# Patient Record
Sex: Female | Born: 1990 | Race: White | Hispanic: No | Smoking: Never smoker
Health system: Southern US, Community
[De-identification: ages and names within clinical notes are randomized; demographics above are authoritative.]

## PROBLEM LIST (undated history)

## (undated) DIAGNOSIS — E663 Overweight: Secondary | ICD-10-CM

## (undated) HISTORY — PX: CHOLECYSTECTOMY: SHX55

## (undated) HISTORY — DX: Overweight: E66.3

## (undated) HISTORY — PX: TUBAL LIGATION: SHX77

## (undated) HISTORY — PX: OTHER SURGICAL HISTORY: SHX169

---
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
## 2010-01-30 IMAGING — US US OB FOLLOW UP
1 series · 14 of 28 positions shown · non-contrast
Comparison: none

OBSTETRICAL ULTRASOUND:
 This ultrasound exam was performed in the [HOSPITAL] Ultrasound Department.  The OB US report was generated in the AS system, and faxed to the ordering physician.  This report is also available in [HOSPITAL]?s AccessANYware and in [REDACTED] PACS.

[Series 1: us ob follow up · 0.27mm/px · 14 of 45 slices shown]
[im 2/45]
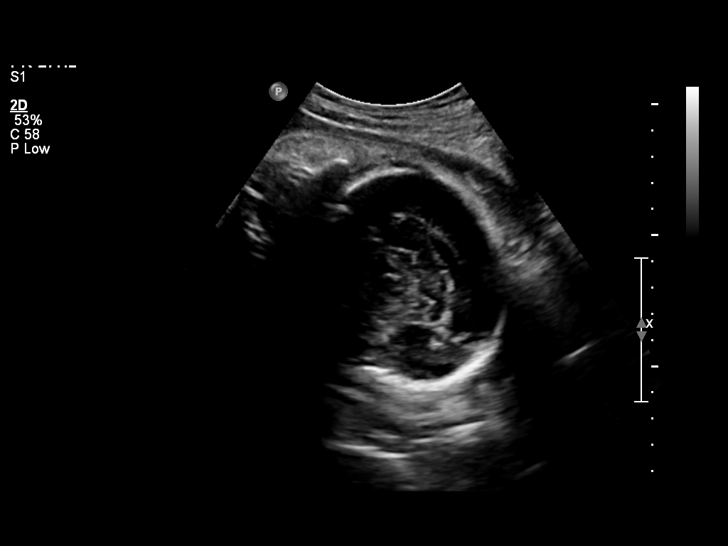
[im 5/45]
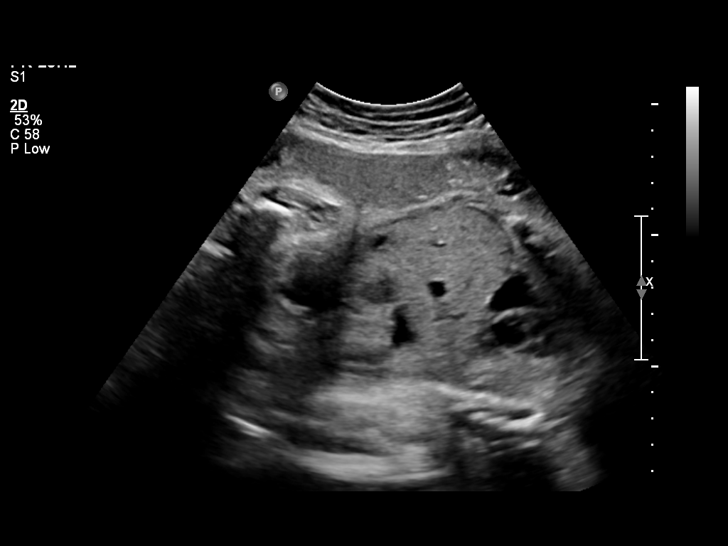
[im 9/45]
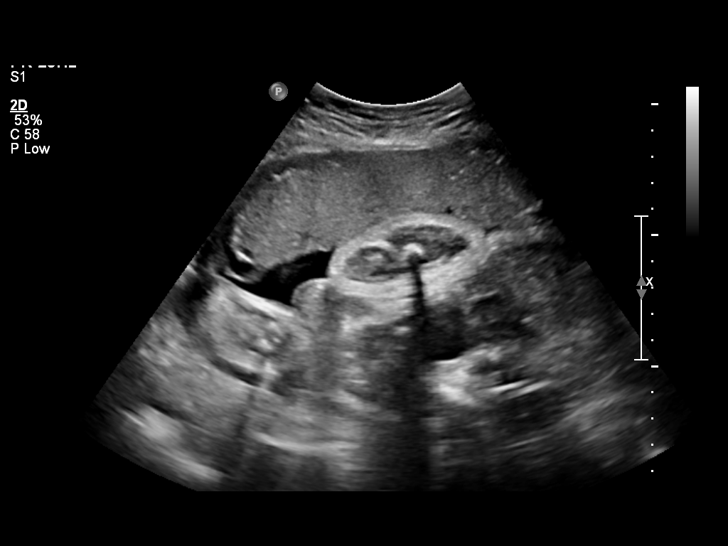
[im 12/45]
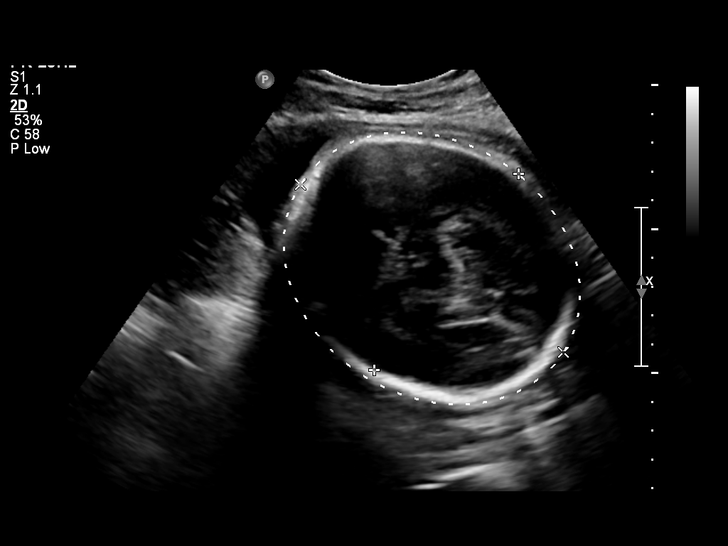
[im 15/45]
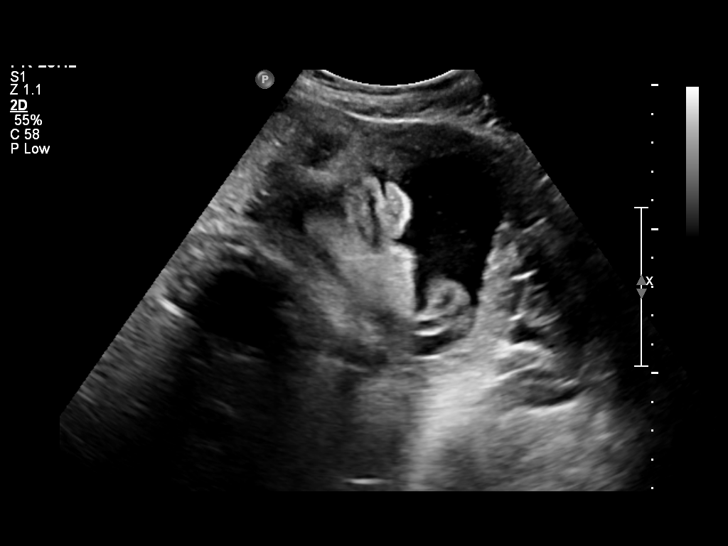
[im 18/45]
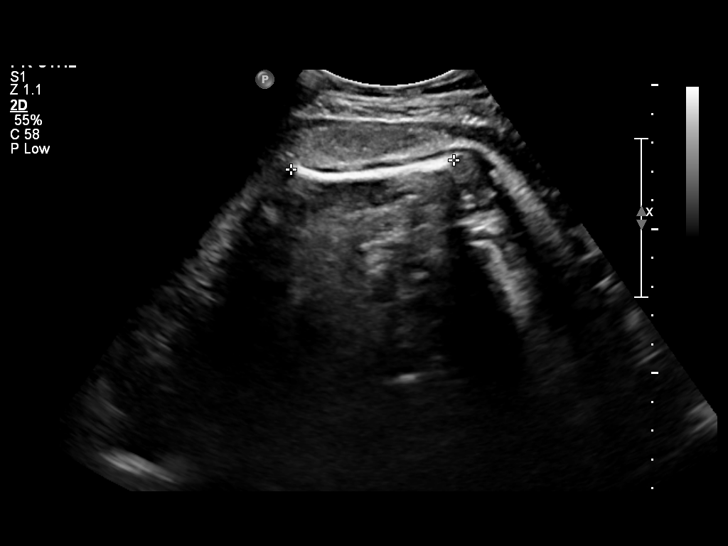
[im 22/45]
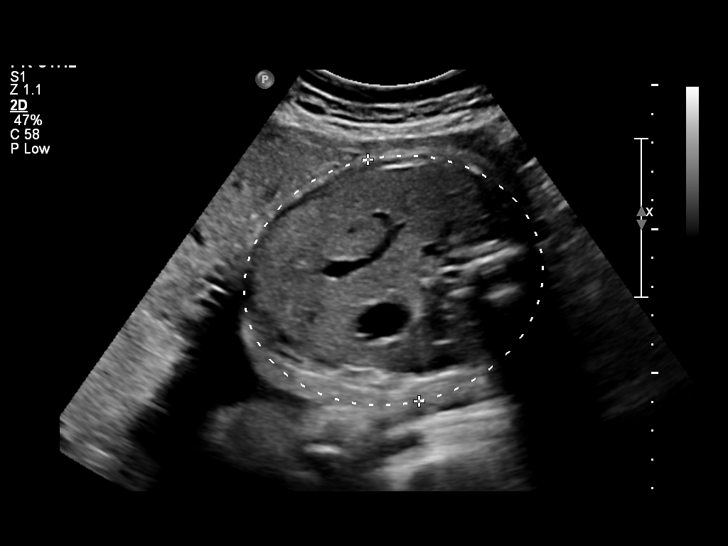
[im 25/45]
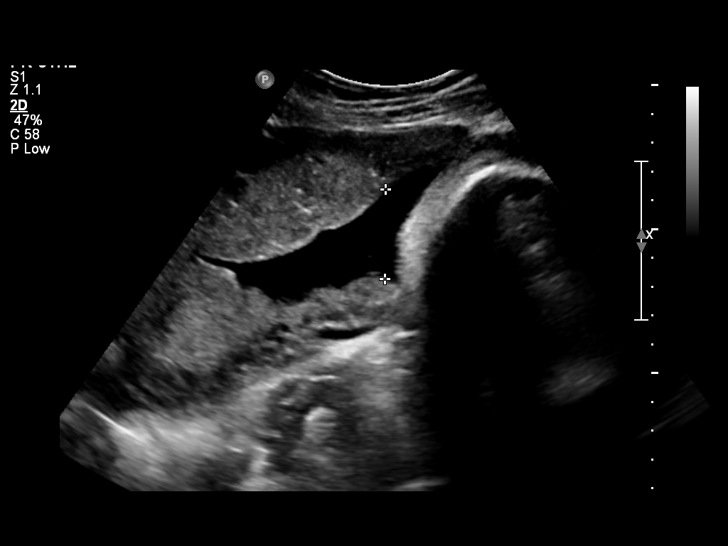
[im 28/45]
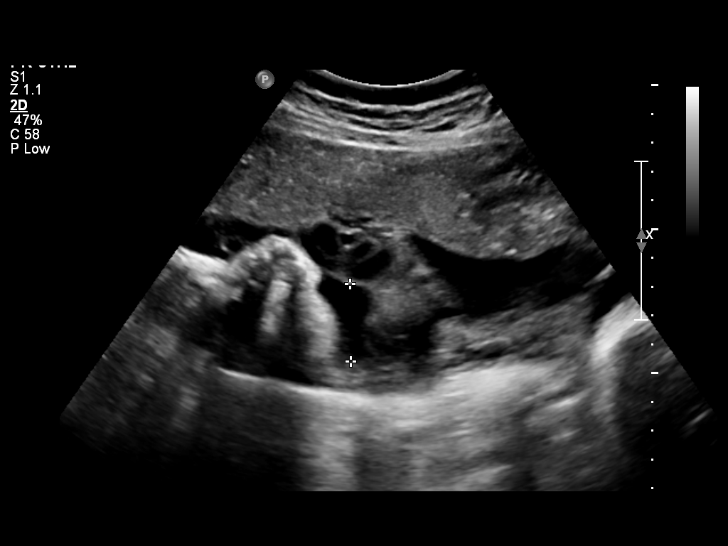
[im 31/45]
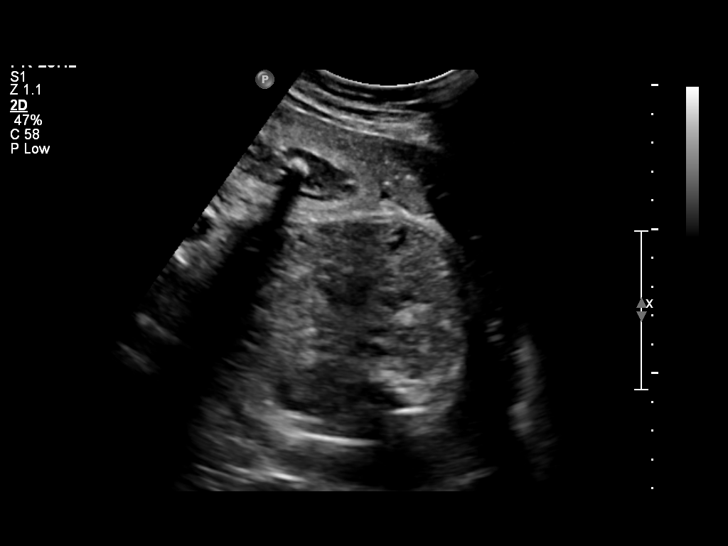
[im 35/45]
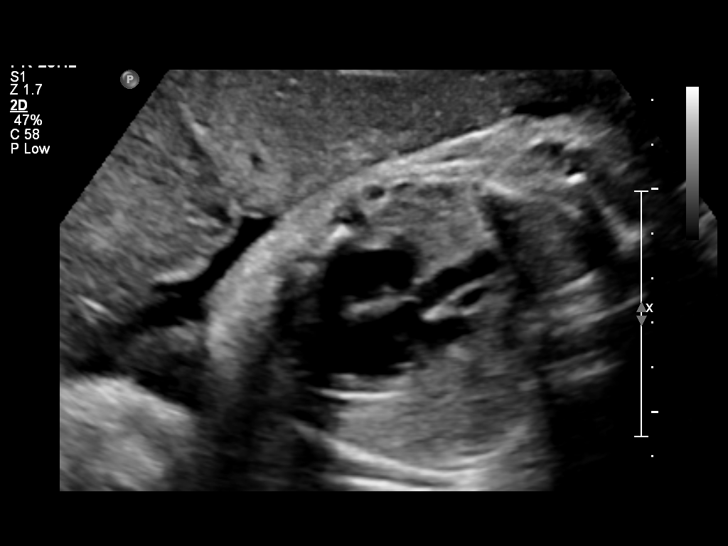
[im 38/45]
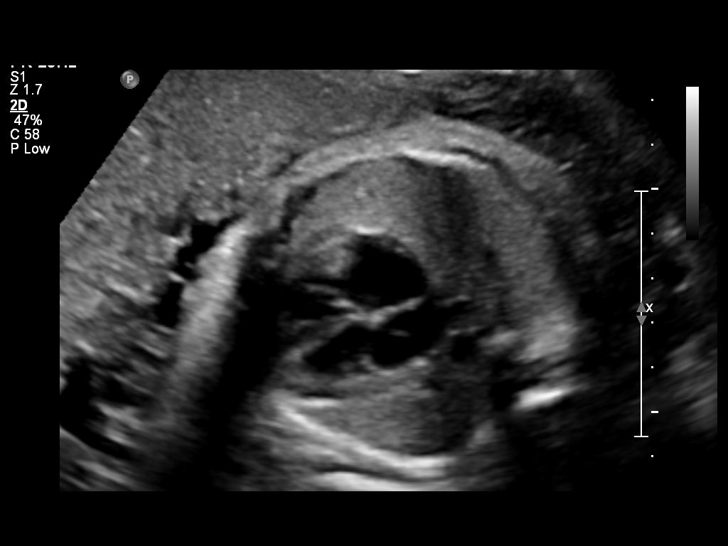
[im 41/45]
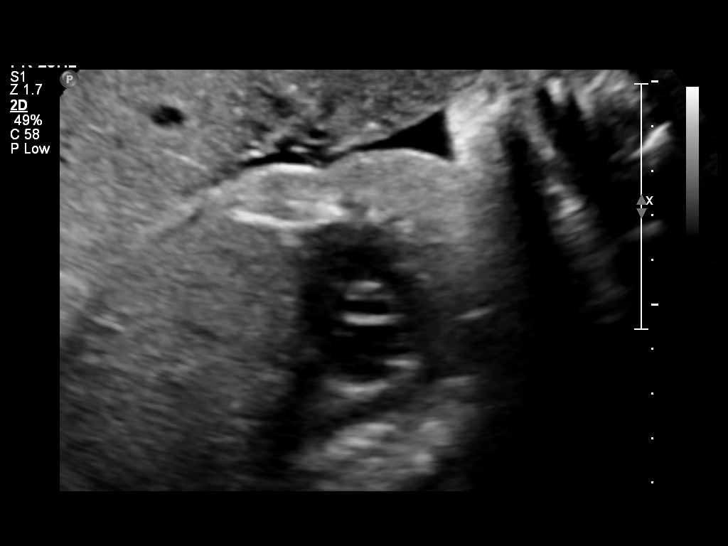
[im 45/45]
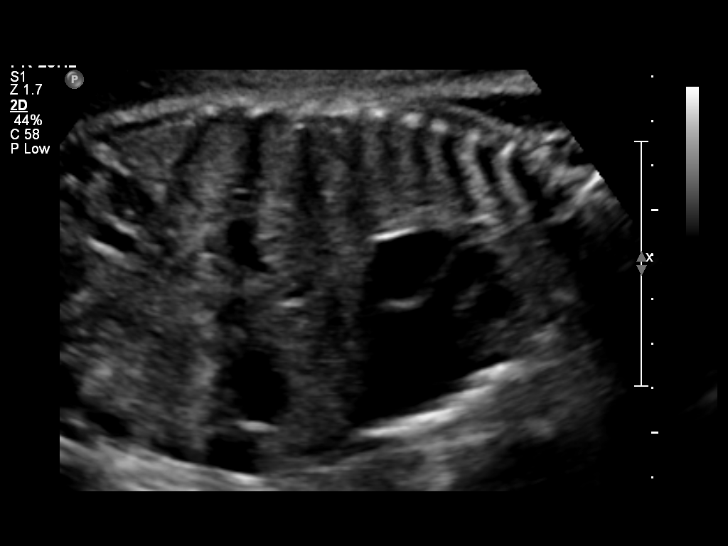

[14 of 28 positions shown; findings below may reference images not displayed]

IMPRESSION: See AS Obstetric US report.

## 2010-08-29 LAB — CBC
HCT: 31.5 % — ABNORMAL LOW (ref 36.0–46.0)
Hemoglobin: 11.3 g/dL — ABNORMAL LOW (ref 12.0–15.0)
MCHC: 34.1 g/dL (ref 30.0–36.0)
MCV: 84 fL (ref 78.0–100.0)
Platelets: 225 10*3/uL (ref 150–400)
RBC: 3.75 MIL/uL — ABNORMAL LOW (ref 3.87–5.11)
RBC: 3.9 MIL/uL (ref 3.87–5.11)
RDW: 13.5 % (ref 11.5–15.5)
WBC: 11.6 10*3/uL — ABNORMAL HIGH (ref 4.0–10.5)

## 2010-08-29 LAB — RPR
RPR Ser Ql: NONREACTIVE
RPR Ser Ql: NONREACTIVE

## 2010-09-01 LAB — URINALYSIS, ROUTINE W REFLEX MICROSCOPIC
Bilirubin Urine: NEGATIVE
Nitrite: NEGATIVE
Specific Gravity, Urine: 1.015 (ref 1.005–1.030)
Urobilinogen, UA: 1 mg/dL (ref 0.0–1.0)
pH: 7.5 (ref 5.0–8.0)

## 2010-09-01 LAB — URINE MICROSCOPIC-ADD ON

## 2010-10-11 NOTE — Assessment & Plan Note (Signed)
NAME:  LEATHIE, WEICH                 ACCOUNT NO.:  [id number]   MEDICAL RECORD NO.:  [id number]          PATIENT TYPE:  POB   LOCATION:  [facility]         FACILITY:  [facility]   PHYSICIAN:  Scheryl Darter, MD       DATE OF BIRTH:  1990-09-20   DATE OF SERVICE:                                  CLINIC NOTE   The patient is postpartum from vaginal delivery on December 30.  Baby is  doing well.  She was 37 weeks' gestation.  She has not had unprotected  intercourse since delivery.  She would like to have Mirena placed.  She  had this placed previously at another clinic and was only kept in for a  month due to bleeding.  She understands now that is fairly normal.  She  does not plan to conceive for at least 6-7 years.  No complaints of  bleeding.   Physical exam, no acute distress.  Abdomen is soft, nontender.  Pelvic  exam, external genitalia.  Vagina and cervix appeared normal.  Uterus is  normal size.  No adnexal masses or tenderness.  She is doing well  postpartum.  She can return for Mirena placement.  Advised her to  abstain from intercourse until the Mirena is placed.      Scheryl Darter, MD     JA/MEDQ  D:  07/08/2009  T:  07/09/2009  Job:  [id number]

## 2012-11-23 ENCOUNTER — Emergency Department: Payer: Self-pay | Admitting: Emergency Medicine

## 2012-11-23 LAB — URINALYSIS, COMPLETE
Bacteria: NONE SEEN
Bilirubin,UR: NEGATIVE
Blood: NEGATIVE
Glucose,UR: NEGATIVE mg/dL (ref 0–75)
Ketone: NEGATIVE
Nitrite: NEGATIVE
RBC,UR: 2 /HPF (ref 0–5)
Specific Gravity: 1.02 (ref 1.003–1.030)

## 2012-11-23 LAB — COMPREHENSIVE METABOLIC PANEL
Alkaline Phosphatase: 81 U/L (ref 50–136)
Anion Gap: 6 — ABNORMAL LOW (ref 7–16)
BUN: 17 mg/dL (ref 7–18)
Bilirubin,Total: 0.5 mg/dL (ref 0.2–1.0)
Co2: 27 mmol/L (ref 21–32)
Creatinine: 0.77 mg/dL (ref 0.60–1.30)
EGFR (African American): >60
Glucose: 84 mg/dL (ref 65–99)
Osmolality: 280 (ref 275–301)
SGOT(AST): 16 U/L (ref 15–37)
SGPT (ALT): 13 U/L (ref 12–78)
Sodium: 140 mmol/L (ref 136–145)

## 2012-11-23 LAB — CBC
HCT: 36 % (ref 35.0–47.0)
HGB: 12.2 g/dL (ref 12.0–16.0)
MCHC: 34 g/dL (ref 32.0–36.0)

## 2012-11-23 LAB — LIPASE, BLOOD: Lipase: 98 U/L (ref 73–393)

## 2013-01-18 ENCOUNTER — Emergency Department: Payer: Self-pay | Admitting: Emergency Medicine

## 2013-01-18 LAB — CBC: RBC: 4.45 10*6/uL (ref 3.80–5.20)

## 2013-01-18 LAB — URINALYSIS, COMPLETE
Glucose,UR: NEGATIVE mg/dL (ref 0–75)
Nitrite: NEGATIVE
Ph: 7 (ref 4.5–8.0)
Protein: NEGATIVE
RBC,UR: <1 /HPF (ref 0–5)
Specific Gravity: 1.02 (ref 1.003–1.030)

## 2013-01-18 LAB — LIPASE, BLOOD: Lipase: 127 U/L (ref 73–393)

## 2013-01-18 LAB — COMPREHENSIVE METABOLIC PANEL
Anion Gap: 3 — ABNORMAL LOW (ref 7–16)
Bilirubin,Total: 0.4 mg/dL (ref 0.2–1.0)
Calcium, Total: 9.4 mg/dL (ref 8.5–10.1)
Creatinine: 0.64 mg/dL (ref 0.60–1.30)
EGFR (Non-African Amer.): >60
Glucose: 86 mg/dL (ref 65–99)
Osmolality: 274 (ref 275–301)
Potassium: 4 mmol/L (ref 3.5–5.1)
SGOT(AST): 17 U/L (ref 15–37)
SGPT (ALT): 14 U/L (ref 12–78)
Total Protein: 7.9 g/dL (ref 6.4–8.2)

## 2013-01-18 LAB — HCG, QUANTITATIVE, PREGNANCY: Beta Hcg, Quant.: <1 m[IU]/mL — ABNORMAL LOW

## 2013-01-18 LAB — WET PREP, GENITAL

## 2013-02-11 ENCOUNTER — Emergency Department: Payer: Self-pay | Admitting: Emergency Medicine

## 2013-02-11 LAB — COMPREHENSIVE METABOLIC PANEL
Albumin: 3.8 g/dL (ref 3.4–5.0)
Alkaline Phosphatase: 78 U/L (ref 50–136)
BUN: 12 mg/dL (ref 7–18)
Calcium, Total: 8.9 mg/dL (ref 8.5–10.1)
Chloride: 104 mmol/L (ref 98–107)
Creatinine: 0.67 mg/dL (ref 0.60–1.30)
Osmolality: 273 (ref 275–301)
Potassium: 3.3 mmol/L — ABNORMAL LOW (ref 3.5–5.1)
SGOT(AST): 17 U/L (ref 15–37)
SGPT (ALT): 13 U/L (ref 12–78)

## 2013-02-11 LAB — URINALYSIS, COMPLETE
Bilirubin,UR: NEGATIVE
Blood: NEGATIVE
Ketone: NEGATIVE
Nitrite: NEGATIVE
Ph: 6 (ref 4.5–8.0)
Protein: NEGATIVE
Specific Gravity: 1.015 (ref 1.003–1.030)
Squamous Epithelial: 8
WBC UR: 1 /HPF (ref 0–5)

## 2013-02-11 LAB — CBC
MCH: 29.1 pg (ref 26.0–34.0)
MCHC: 34.3 g/dL (ref 32.0–36.0)
Platelet: 246 10*3/uL (ref 150–440)
WBC: 11.6 10*3/uL — ABNORMAL HIGH (ref 3.6–11.0)

## 2013-02-12 ENCOUNTER — Emergency Department: Payer: Self-pay | Admitting: Emergency Medicine

## 2013-02-12 LAB — HCG, QUANTITATIVE, PREGNANCY: Beta Hcg, Quant.: 474 m[IU]/mL — ABNORMAL HIGH

## 2013-03-20 ENCOUNTER — Emergency Department: Payer: Self-pay | Admitting: Internal Medicine

## 2013-03-20 LAB — CBC
HCT: 33.7 % — ABNORMAL LOW (ref 35.0–47.0)
HGB: 12 g/dL (ref 12.0–16.0)
MCH: 29.3 pg (ref 26.0–34.0)
MCHC: 35.6 g/dL (ref 32.0–36.0)
RBC: 4.09 10*6/uL (ref 3.80–5.20)
RDW: 13.5 % (ref 11.5–14.5)
WBC: 11.1 10*3/uL — ABNORMAL HIGH (ref 3.6–11.0)

## 2013-03-20 LAB — BASIC METABOLIC PANEL
Calcium, Total: 9.3 mg/dL (ref 8.5–10.1)
Co2: 23 mmol/L (ref 21–32)
Creatinine: 0.47 mg/dL — ABNORMAL LOW (ref 0.60–1.30)
EGFR (Non-African Amer.): >60
Glucose: 80 mg/dL (ref 65–99)
Osmolality: 267 (ref 275–301)
Sodium: 134 mmol/L — ABNORMAL LOW (ref 136–145)

## 2013-03-20 LAB — URINALYSIS, COMPLETE
Bilirubin,UR: NEGATIVE
Blood: NEGATIVE
Glucose,UR: NEGATIVE mg/dL (ref 0–75)
Leukocyte Esterase: NEGATIVE
Protein: NEGATIVE
RBC,UR: 1 /HPF (ref 0–5)
Squamous Epithelial: 3
WBC UR: 1 /HPF (ref 0–5)

## 2013-03-20 LAB — GC/CHLAMYDIA PROBE AMP

## 2013-04-14 ENCOUNTER — Encounter: Payer: Self-pay | Admitting: Maternal and Fetal Medicine

## 2013-08-20 ENCOUNTER — Observation Stay: Payer: Self-pay | Admitting: Obstetrics & Gynecology

## 2013-08-20 LAB — URINALYSIS, COMPLETE
BLOOD: NEGATIVE
Bacteria: NONE SEEN
Bilirubin,UR: NEGATIVE
GLUCOSE, UR: NEGATIVE mg/dL (ref 0–75)
KETONE: NEGATIVE
LEUKOCYTE ESTERASE: NEGATIVE
Nitrite: NEGATIVE
Ph: 7 (ref 4.5–8.0)
RBC, UR: NONE SEEN /HPF (ref 0–5)
Specific Gravity: 1.025 (ref 1.003–1.030)
Squamous Epithelial: 7
WBC UR: <1 /HPF (ref 0–5)

## 2013-10-01 ENCOUNTER — Observation Stay: Payer: Self-pay

## 2013-10-14 ENCOUNTER — Inpatient Hospital Stay: Payer: Self-pay

## 2013-10-14 LAB — CBC WITH DIFFERENTIAL/PLATELET
Basophil #: 0.1 10*3/uL (ref 0.0–0.1)
Basophil %: 0.6 %
Eosinophil #: 0.1 10*3/uL (ref 0.0–0.7)
Eosinophil %: 1 %
HCT: 33.1 % — AB (ref 35.0–47.0)
HGB: 10.9 g/dL — ABNORMAL LOW (ref 12.0–16.0)
Lymphocyte #: 2.5 10*3/uL (ref 1.0–3.6)
Lymphocyte %: 20.6 %
MCH: 26.1 pg (ref 26.0–34.0)
MCHC: 32.8 g/dL (ref 32.0–36.0)
MCV: 80 fL (ref 80–100)
Monocyte #: 0.8 x10 3/mm (ref 0.2–0.9)
Monocyte %: 6.1 %
NEUTROS ABS: 8.9 10*3/uL — AB (ref 1.4–6.5)
Neutrophil %: 71.7 %
Platelet: 219 10*3/uL (ref 150–440)
RBC: 4.16 10*6/uL (ref 3.80–5.20)
RDW: 14.2 % (ref 11.5–14.5)
WBC: 12.3 10*3/uL — ABNORMAL HIGH (ref 3.6–11.0)

## 2013-10-15 LAB — HEMATOCRIT: HCT: 28.3 % — AB (ref 35.0–47.0)

## 2013-12-05 ENCOUNTER — Emergency Department: Payer: Self-pay | Admitting: Emergency Medicine

## 2013-12-07 ENCOUNTER — Emergency Department: Payer: Self-pay | Admitting: Emergency Medicine

## 2013-12-09 ENCOUNTER — Emergency Department: Payer: Self-pay | Admitting: Emergency Medicine

## 2014-01-14 IMAGING — US US OB NUCHAL TRANSLUCENCY 1ST GEST - [facility] NRPT
1 series · 14 of 28 positions shown · non-contrast
Comparison: none

[Series 1: us ob nuchal translucency 1st gest - [facility] nrpt · 0.23mm/px · 14 of 37 slices shown]
[im 2/37]
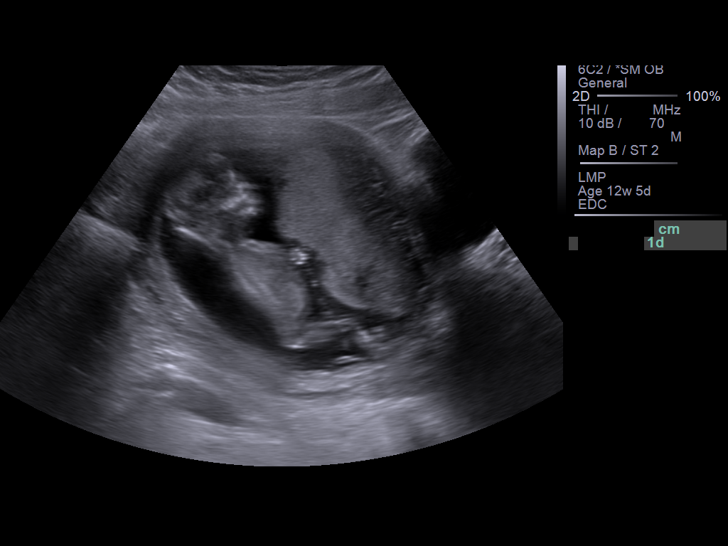
[im 5/37]
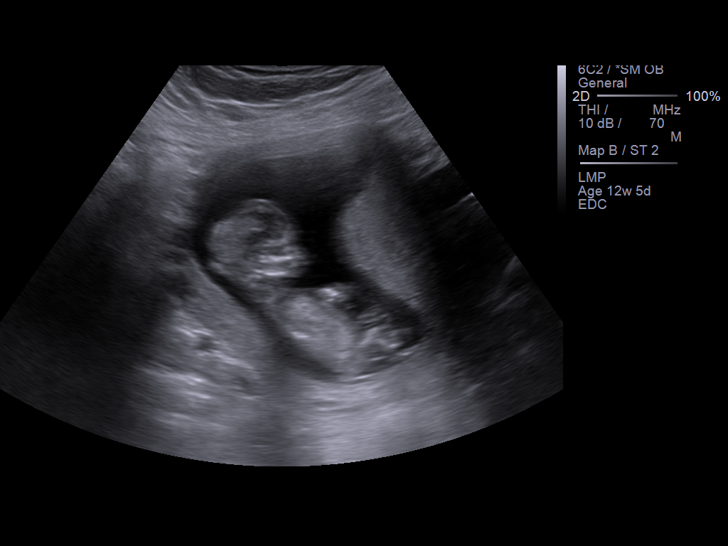
[im 7/37]
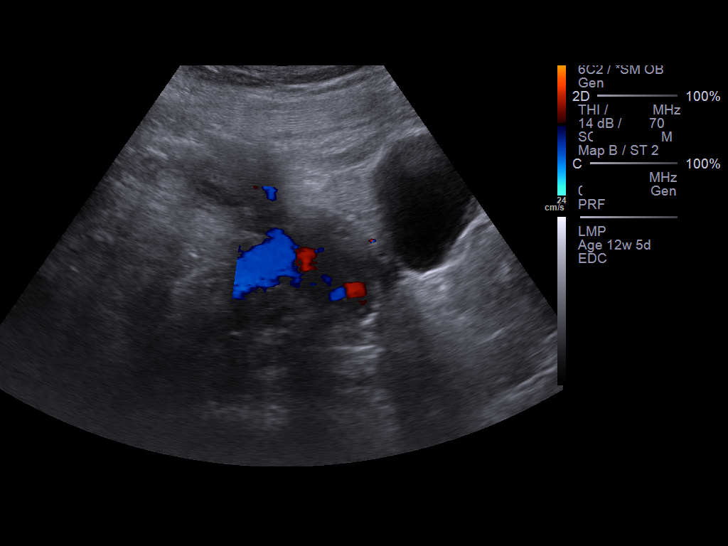
[im 10/37]
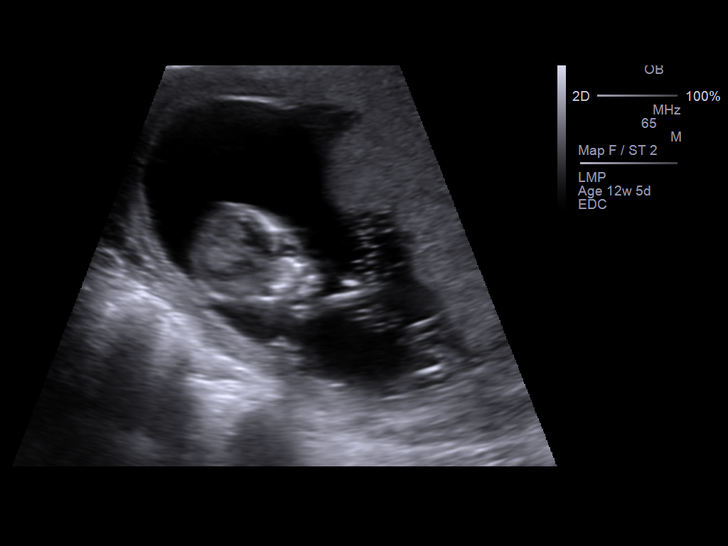
[im 13/37]
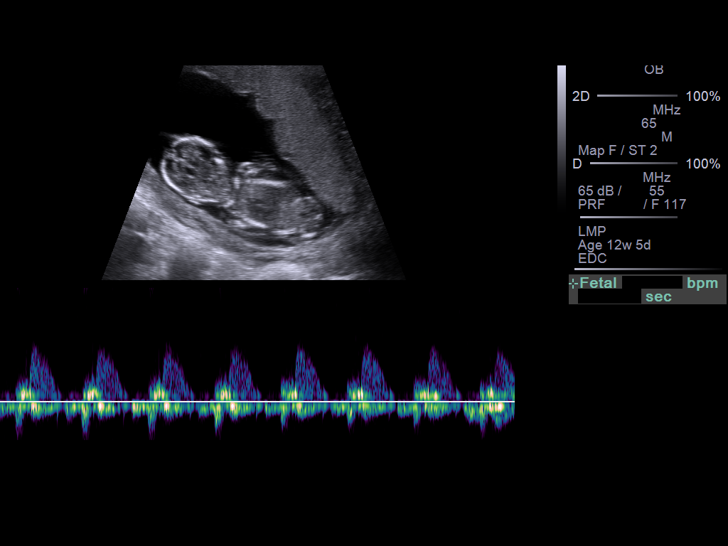
[im 15/37]
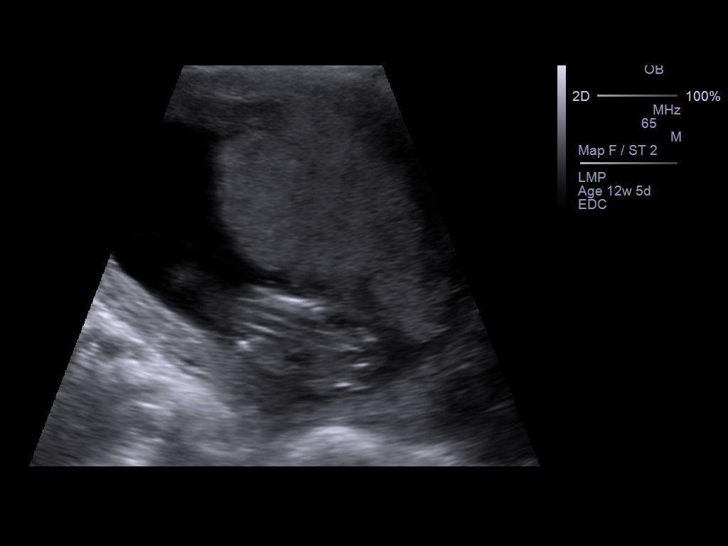
[im 18/37]
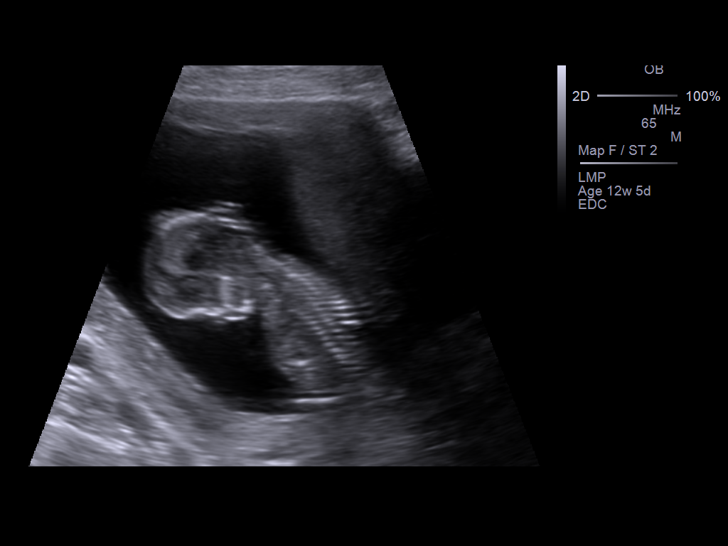
[im 21/37]
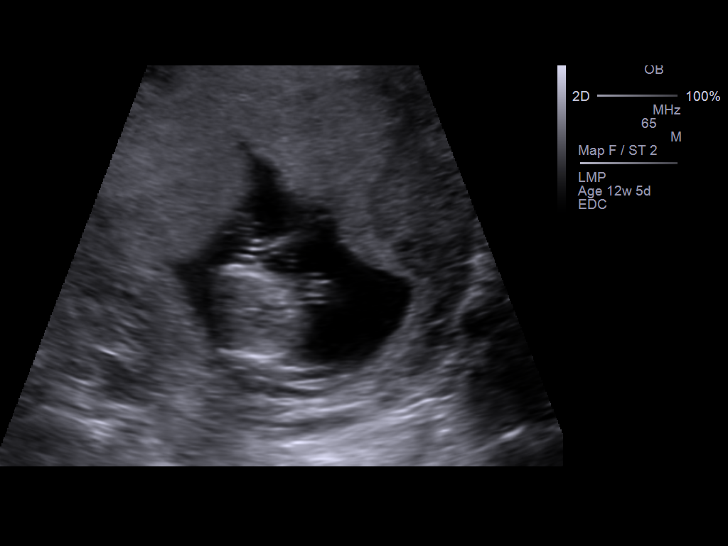
[im 23/37]
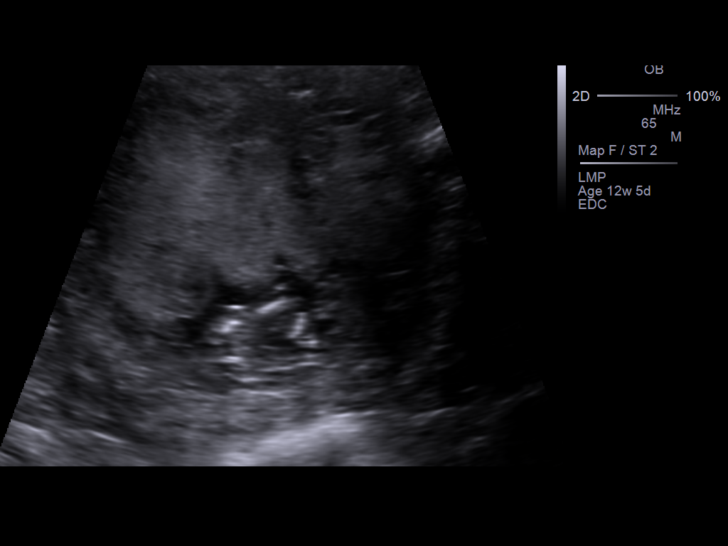
[im 26/37]
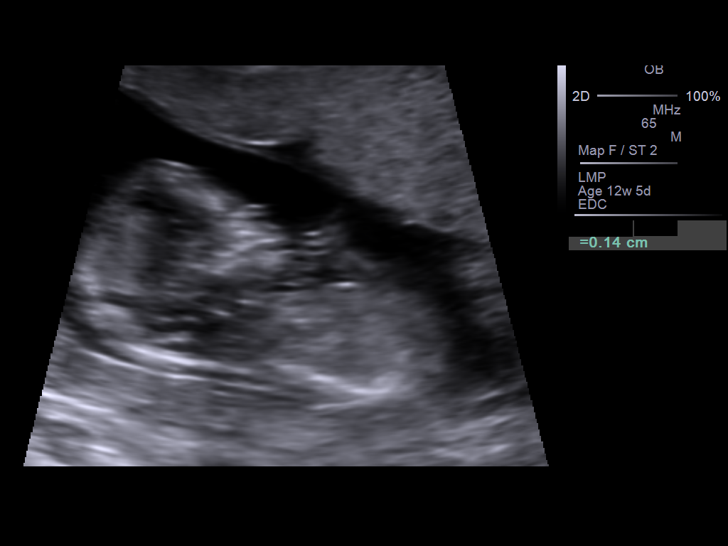
[im 29/37]
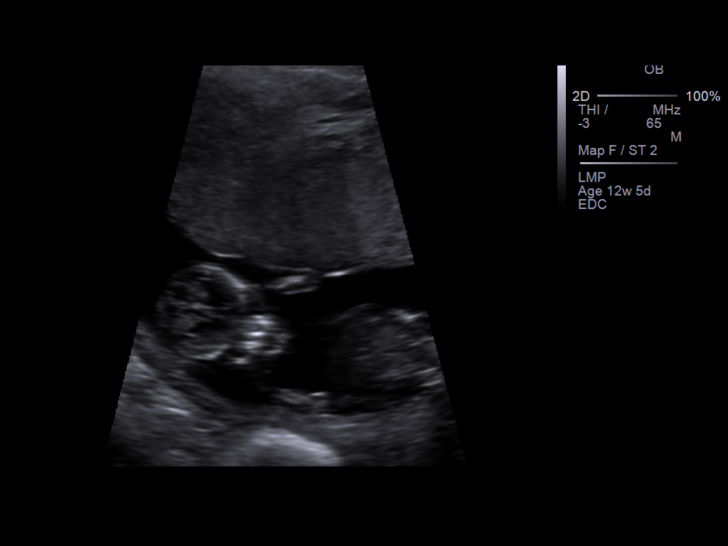
[im 31/37]
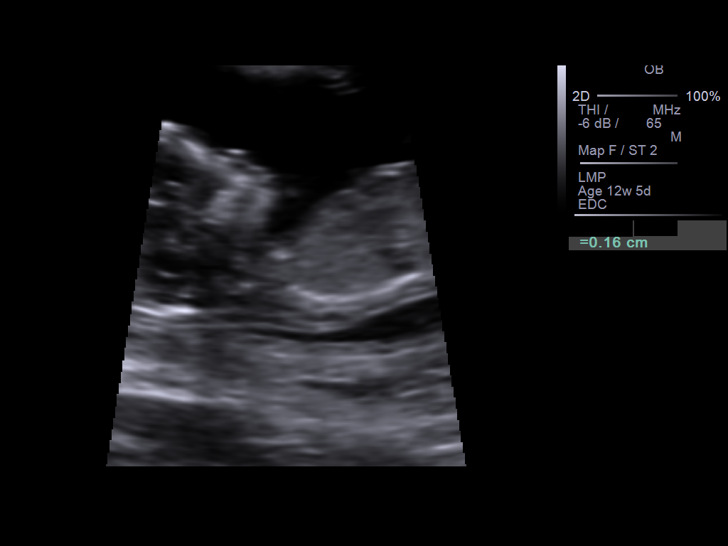
[im 34/37]
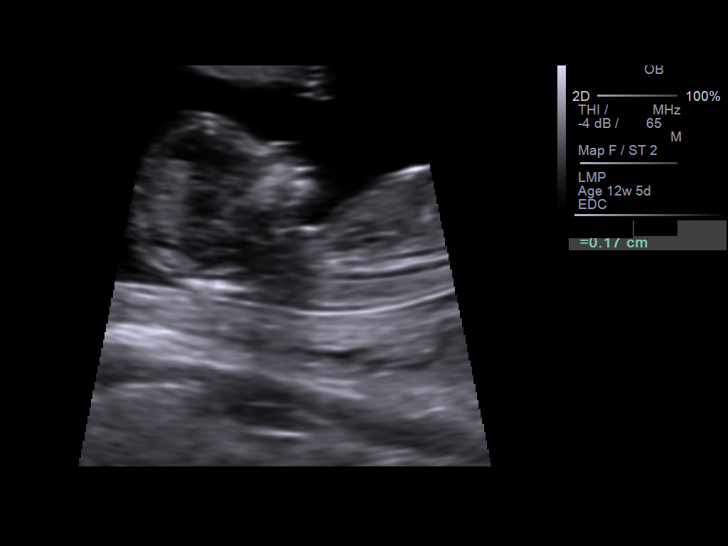
[im 37/37]
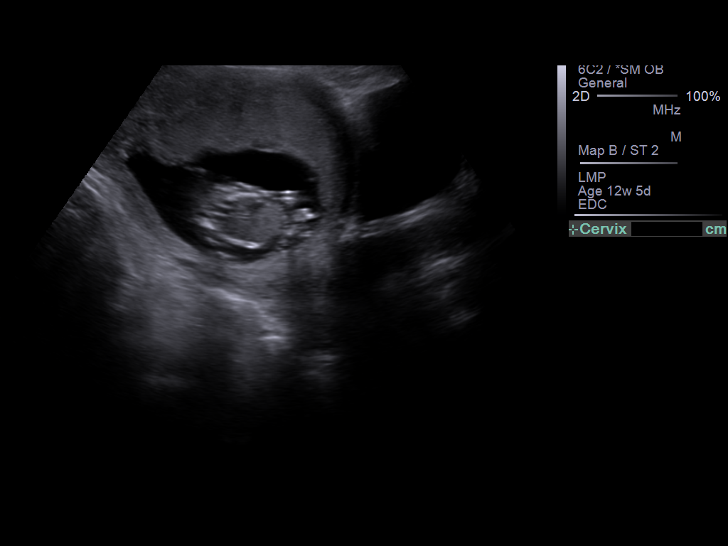

[14 of 28 positions shown; findings below may reference images not displayed]

IMAGES IMPORTED FROM THE SYNGO WORKFLOW SYSTEM
NO DICTATION FOR STUDY

## 2014-02-04 ENCOUNTER — Ambulatory Visit: Payer: Self-pay | Admitting: Obstetrics and Gynecology

## 2014-02-04 LAB — HEMOGLOBIN: HGB: 11.3 g/dL — ABNORMAL LOW (ref 12.0–16.0)

## 2014-02-10 ENCOUNTER — Ambulatory Visit: Payer: Self-pay | Admitting: Obstetrics and Gynecology

## 2014-09-19 NOTE — Op Note (Signed)
PATIENT NAME:  Cynthia Lara, [name] MR#:  [id number] DATE OF BIRTH:  23-Aug-1990  DATE OF PROCEDURE:  02/10/2014  PREOPERATIVE DIAGNOSIS:  Desires permanent surgical sterilization.   POSTOPERATIVE DIAGNOSIS:  Desires permanent surgical sterilization.   OPERATION PERFORMED:  Laparoscopic bilateral tubal ligation via Falope ring application.   PREOPERATIVE ANTIBIOTICS:  None.   ANESTHESIA:  General.   PRIMARY SURGEON:  Lorrene ReidAndreas M Mionna Advincula, MD.   DRAINS AND TUBES:  None.   IMPLANTS:  None.   ESTIMATED BLOOD LOSS:  Minimal.   IV FLUIDS:  800 mL crystalloid.   URINE OUTPUT:  25 mL of clear urine.   COMPLICATIONS:  None.   INTRAOPERATIVE FINDINGS:  Normal intra-abdominal anatomy including uterus, ovaries, and tubes. The anterior and posterior cul-de-sac was normal in appearance. The liver edge was visualized and was grossly normal. Both ureters were visualized and appeared to be coursing in normal direction.   Patient Condition Following Procedure:  Stable.   PROCEDURE IN DETAIL:  Risks, benefits, and alternatives of the procedure were discussed with the patient prior proceeding to the operating room and particular attention was paid to make the patient aware of the permanent nature of this procedure. The patient was taken to the operating room where she was placed under general endotracheal anesthesia using LMA airway. She was positioned in the dorsal lithotomy position using Allen stirrups, prepped and draped in the usual sterile fashion. A timeout was performed.   Attention was turned to the patient's pelvis. The bladder was straight catheterized with a red rubber catheter for 25 mL of clear urine. An operative speculum was then placed. The anterior lip of the cervix was visualized, grasped with a single-tooth tenaculum and a Hulka tenaculum was then placed to allow manipulation of the uterus. The single-tooth tenaculum and speculum were removed.   Attention was turned to the patient's  abdomen. The umbilicus was infiltrated with 0.5% Sensorcaine. A stab incision was made at the base of the umbilicus and a 5-mm Xcel trocar was used to gain direct entry into the peritoneal cavity. Pneumoperitoneum was established and general inspection of the abdomen revealed normal findings. A second 8-mm suprapubic trocar was then placed under direct visualization. The left tube was identified. A mid isthmic portion was grasped using the Falope ring applicator and a Falope ring was applied noting a good 1-cm knuckle of tube within the ring and good blanching of that tube. A mid isthmic portion of the right tube was similarly identified and a Falope ring was applied noting again a good 1-cm knuckle of tube with good blanching of the tube within the ring.   Following this, pneumoperitoneum was evacuated. The 8-mm port site was closed with 4-0 Monocryl in a subcuticular fashion. All port sites were then dressed with Dermabond. Sponge, needle, and instrument counts were correct x2. The patient tolerated the procedure well and was taken to the recovery room in stable condition.   ____________________________ Florina OuAndreas M. Bonney AidStaebler, MD ams:lt D: 02/10/2014 08:47:37 ET T: 02/10/2014 09:05:05 ET JOB#: [id number]  cc: Florina OuAndreas M. Bonney AidStaebler, MD, <Dictator> Lorrene ReidANDREAS M Uriyah Massimo MD ELECTRONICALLY SIGNED 02/25/2014 8:02

## 2014-10-06 NOTE — H&P (Signed)
L&D Evaluation:  History Expanded:  HPI 24 yo G3 P1011, EDD of 10/22/13 per 9 wk US, presents at 38.6 wks with c/o strong  ctx since midnight. No LOF, VB or decreased FM. [facility]. Recently treated for BV & yeast. GBS +. Received TDAP 09/01/13.   Blood Type (Maternal) A positive   Group B Strep Results Maternal (Result >5wks must be treated as unknown) positive   Maternal HIV Negative   Maternal Syphilis Ab Nonreactive   Maternal Varicella Immune   Rubella Results (Maternal) immune   Maternal T-Dap Immune   Patient's Medical History No Chronic Illness   Patient's Surgical History D&C   Medications Pre Natal Vitamins   Allergies NKDA   Social History none   Exam:  Vital Signs stable   General no apparent distress, appears uncomfortable with ctx   Mental Status clear   Chest clear   Heart no murmur/gallop/rubs   Abdomen gravid, tender with contractions   Estimated Fetal Weight Average for gestational age, 236 1/2 - 7 lbs   Pelvic 6-7 per RN   Mebranes Intact   FHT normal rate with no decels, category 1 tracing   Ucx regular   Impression:  Impression active labor   Plan:  Plan antibiotics for GBBS prophylaxis   Comments Received IV stadol, awaiting labs for epidural   Electronic Signatures: Vella KohlerBrothers, Jourden Delmont K (CNM)  (Signed 19-May-15 03:49)  Authored: L&D Evaluation   Last Updated: 19-May-15 03:49 by Vella KohlerBrothers, Kenston Longton K (CNM)

## 2014-12-28 ENCOUNTER — Encounter: Payer: Self-pay | Admitting: Emergency Medicine

## 2014-12-28 ENCOUNTER — Emergency Department
Admission: EM | Admit: 2014-12-28 | Discharge: 2014-12-28 | Disposition: A | Discharge status: Home / Self Care | Payer: Medicaid Other | Attending: Emergency Medicine | Admitting: Emergency Medicine

## 2014-12-28 DIAGNOSIS — Y998 Other external cause status: Secondary | ICD-10-CM | POA: Diagnosis not present

## 2014-12-28 DIAGNOSIS — Y9389 Activity, other specified: Secondary | ICD-10-CM | POA: Insufficient documentation

## 2014-12-28 DIAGNOSIS — Y9289 Other specified places as the place of occurrence of the external cause: Secondary | ICD-10-CM | POA: Diagnosis not present

## 2014-12-28 DIAGNOSIS — S3992XA Unspecified injury of lower back, initial encounter: Secondary | ICD-10-CM | POA: Diagnosis present

## 2014-12-28 DIAGNOSIS — M62830 Muscle spasm of back: Secondary | ICD-10-CM | POA: Insufficient documentation

## 2014-12-28 DIAGNOSIS — X58XXXA Exposure to other specified factors, initial encounter: Secondary | ICD-10-CM | POA: Insufficient documentation

## 2014-12-28 MED ORDER — CYCLOBENZAPRINE HCL 10 MG PO TABS: 10.0000 mg | ORAL_TABLET | Freq: Three times a day (TID) | ORAL | Status: AC | PRN

## 2014-12-28 NOTE — Discharge Instructions (Signed)

## 2014-12-28 NOTE — ED Notes (Signed)
Developed lower back pain after picking up some thing heavy this am.  Non radiating  Ambulates well to treatment

## 2014-12-28 NOTE — ED Notes (Signed)
Pt reports picking up something heavy this morning, reports "hearing a pop" in lower back, pt reports later on she started having some spotting vaginal bleeding.

## 2014-12-28 NOTE — ED Provider Notes (Signed)
[facility] Emergency Department Provider Note  ____________________________________________  Time seen: On arrival  I have reviewed the triage vital signs and the nursing notes.   HISTORY  Chief Complaint Back Pain    HPI Cynthia Lara is a 24 y.o. female who presents back pain. She reports this morning she picked up something heavy and felt a pop in her back. She denies numbness tingling or weakness in her legs. No difficulty urinating. No IV drug abuse. No fevers no chills. She is ambulating well.    History reviewed. No pertinent past medical history.  There are no active problems to display for this patient.   Past Surgical History  Procedure Laterality Date  . Tubal ligation      Current Outpatient Rx  Name  Route  Sig  Dispense  Refill  . cyclobenzaprine (FLEXERIL) 10 MG tablet   Oral   Take 1 tablet (10 mg total) by mouth every 8 (eight) hours as needed for muscle spasms.   15 tablet   1     Allergies Review of patient's allergies indicates no known allergies.  No family history on file.  Social History History  Substance Use Topics  . Smoking status: Never Smoker   . Smokeless tobacco: Not on file  . Alcohol Use: No    Review of Systems  Constitutional: Negative for fever. Eyes: Negative for visual changes. ENT: Negative for sore throat   Genitourinary: Negative for dysuria. Musculoskeletal: Negative for back pain. Skin: Negative for rash. Neurological: Negative for headaches or focal weakness   ____________________________________________   PHYSICAL EXAM:  VITAL SIGNS: ED Triage Vitals  Enc Vitals Group     BP 12/28/14 1356 120/74 mmHg     Pulse Rate 12/28/14 1356 72     Resp 12/28/14 1356 16     Temp 12/28/14 1356 98.5 F (36.9 C)     Temp Source 12/28/14 1356 Oral     SpO2 12/28/14 1356 98 %     Weight 12/28/14 1356 170 lb (77.111 kg)     Height 12/28/14 1356  (1.549 m)     Head Cir --    Peak Flow --      Pain Score 12/28/14 1356 8     Pain Loc --      Pain Edu? --      Excl. in GC? --      Constitutional: Alert and oriented. Well appearing and in no distress. Eyes: Conjunctivae are normal.  ENT   Head: Normocephalic and atraumatic.   Mouth/Throat: Mucous membranes are moist. Cardiovascular: Normal rate, regular rhythm.  Respiratory: Normal respiratory effort without tachypnea nor retractions.  Gastrointestinal: Soft and non-tender in all quadrants. No distention. There is no CVA tenderness. Musculoskeletal: Nontender with normal range of motion in all extremities. Mild tenderness to palpation in the right paraspinal region consistent with a muscle spasm. No vertebral tenderness to palpation Neurologic:  Normal speech and language. No gross focal neurologic deficits are appreciated. Skin:  Skin is warm, dry and intact. No rash noted. Psychiatric: Mood and affect are normal. Patient exhibits appropriate insight and judgment.  ____________________________________________    LABS (pertinent positives/negatives)  Labs Reviewed - No data to display  ____________________________________________     ____________________________________________    RADIOLOGY I have personally reviewed any xrays that were ordered on this patient: None  ____________________________________________   PROCEDURES  Procedure(s) performed: none   ____________________________________________   INITIAL IMPRESSION / ASSESSMENT AND PLAN / ED COURSE  Pertinent labs & imaging results that were available during my care of the patient were reviewed by me and considered in my medical decision making (see chart for details).  Patient well-appearing. Exam consistent with muscle spasm. We'll treat with muscle relaxer and NSAIDs. Follow-up with PCP as needed  ____________________________________________   FINAL CLINICAL IMPRESSION(S) / ED DIAGNOSES  Final diagnoses:  Muscle  spasm of back     Jene Every, MD 12/28/14 1816

## 2015-12-15 ENCOUNTER — Ambulatory Visit (INDEPENDENT_AMBULATORY_CARE_PROVIDER_SITE_OTHER): Payer: Medicaid Other | Admitting: Obstetrics and Gynecology

## 2015-12-15 ENCOUNTER — Other Ambulatory Visit: Payer: Self-pay | Admitting: Obstetrics and Gynecology

## 2015-12-15 ENCOUNTER — Encounter: Payer: Self-pay | Admitting: Obstetrics and Gynecology

## 2015-12-15 VITALS — BP 95/67 | HR 72 | Ht 61.0 in | Wt 158.5 lb

## 2015-12-15 DIAGNOSIS — N760 Acute vaginitis: Secondary | ICD-10-CM

## 2015-12-15 DIAGNOSIS — Z0000 Encounter for general adult medical examination without abnormal findings: Secondary | ICD-10-CM

## 2015-12-15 DIAGNOSIS — A499 Bacterial infection, unspecified: Secondary | ICD-10-CM

## 2015-12-15 DIAGNOSIS — B9689 Other specified bacterial agents as the cause of diseases classified elsewhere: Secondary | ICD-10-CM

## 2015-12-15 DIAGNOSIS — Z01419 Encounter for gynecological examination (general) (routine) without abnormal findings: Secondary | ICD-10-CM | POA: Diagnosis not present

## 2015-12-15 MED ORDER — TINIDAZOLE 500 MG PO TABS: 500.0000 mg | ORAL_TABLET | Freq: Two times a day (BID) | ORAL | Status: DC

## 2015-12-15 NOTE — Patient Instructions (Addendum)
Bacterial Vaginosis Bacterial vaginosis is a vaginal infection that occurs when the normal balance of bacteria in the vagina is disrupted. It results from an overgrowth of certain bacteria. This is the most common vaginal infection in women of childbearing age. Treatment is important to prevent complications, especially in pregnant women, as it can cause a premature delivery. CAUSES  Bacterial vaginosis is caused by an increase in harmful bacteria that are normally present in smaller amounts in the vagina. Several different kinds of bacteria can cause bacterial vaginosis. However, the reason that the condition develops is not fully understood. RISK FACTORS Certain activities or behaviors can put you at an increased risk of developing bacterial vaginosis, including:  Having a new sex partner or multiple sex partners.  Douching.  Using an intrauterine device (IUD) for contraception. Women do not get bacterial vaginosis from toilet seats, bedding, swimming pools, or contact with objects around them. SIGNS AND SYMPTOMS  Some women with bacterial vaginosis have no signs or symptoms. Common symptoms include:  Grey vaginal discharge.  A fishlike odor with discharge, especially after sexual intercourse.  Itching or burning of the vagina and vulva.  Burning or pain with urination. DIAGNOSIS  Your health care provider will take a medical history and examine the vagina for signs of bacterial vaginosis. A sample of vaginal fluid may be taken. Your health care provider will look at this sample under a microscope to check for bacteria and abnormal cells. A vaginal pH test may also be done.  TREATMENT  Bacterial vaginosis may be treated with antibiotic medicines. These may be given in the form of a pill or a vaginal cream. A second round of antibiotics may be prescribed if the condition comes back after treatment. Because bacterial vaginosis increases your risk for sexually transmitted diseases, getting  treated can help reduce your risk for chlamydia, gonorrhea, HIV, and herpes. HOME CARE INSTRUCTIONS   Only take over-the-counter or prescription medicines as directed by your health care provider.  If antibiotic medicine was prescribed, take it as directed. Make sure you finish it even if you start to feel better.  Tell all sexual partners that you have a vaginal infection. They should see their health care provider and be treated if they have problems, such as a mild rash or itching.  During treatment, it is important that you follow these instructions:  Avoid sexual activity or use condoms correctly.  Do not douche.  Avoid alcohol as directed by your health care provider.  Avoid breastfeeding as directed by your health care provider. SEEK MEDICAL CARE IF:   Your symptoms are not improving after 3 days of treatment.  You have increased discharge or pain.  You have a fever. MAKE SURE YOU:   Understand these instructions.  Will watch your condition.  Will get help right away if you are not doing well or get worse. FOR MORE INFORMATION  Centers for Disease Control and Prevention, Division of STD Prevention: SolutionApps.co.zawww.cdc.gov/std American Sexual Health Association (ASHA): www.ashastd.org    This information is not intended to replace advice given to you by your health care provider. Make sure you discuss any questions you have with your health care provider.   Document Released: 05/15/2005 Document Revised: 06/05/2014 Document Reviewed: 12/25/2012 Elsevier Interactive Patient Education 2016 [employer]  Place annual gynecologic exam patient instructions here.  Thank you for enrolling in MyChart. Please follow the instructions below to securely access your online medical record. MyChart allows you to send messages to your doctor, view  your test results, manage appointments, and more.   How Do I Sign Up? 1. In your Internet browser, go to [employer] and enter  [email or web address]. 2. Click on the Sign Up Now link in the Sign In box. You will see the New Member Sign Up page. 3. Enter your MyChart Access Code exactly as it appears below. You will not need to use this code after you've completed the sign-up process. If you do not sign up before the expiration date, you must request a new code.  MyChart Access Code: [email or web address] Expires: 12/17/2015  3:35 PM  4. Enter your Social Security Number ([id number]) and Date of Birth (mm/dd/yyyy) as indicated and click Submit. You will be taken to the next sign-up page. 5. Create a MyChart ID. This will be your MyChart login ID and cannot be changed, so think of one that is secure and easy to remember. 6. Create a MyChart password. You can change your password at any time. 7. Enter your Password Reset Question and Answer. This can be used at a later time if you forget your password.  8. Enter your e-mail address. You will receive e-mail notification when new information is available in MyChart. 9. Click Sign Up. You can now view your medical record.   Additional Information Remember, MyChart is NOT to be used for urgent needs. For medical emergencies, dial 911.

## 2015-12-15 NOTE — Progress Notes (Signed)
  Subjective:     Cynthia Lara is a 25 y.o. female and is here for a comprehensive physical exam. The patient reports problems - occasional pain and bleeding with sex.  Social History   Social History  . Marital Status: Single    Spouse Name: N/A  . Number of Children: N/A  . Years of Education: N/A   Occupational History  . Not on file.   Social History Main Topics  . Smoking status: Never Smoker   . Smokeless tobacco: Never Used  . Alcohol Use: Yes     Comment: occas  . Drug Use: No  . Sexual Activity: Yes    Birth Control/ Protection: Surgical   Other Topics Concern  . Not on file   Social History Narrative   Health Maintenance  Topic Date Due  . Janet BerlinETANUS/TDAP  07/25/2009  . PAP SMEAR  07/26/2011  . INFLUENZA VACCINE  12/28/2015  . HIV Screening  Completed    The following portions of the patient's history were reviewed and updated as appropriate: allergies, current medications, past family history, past medical history, past social history, past surgical history and problem list.  Review of Systems Pertinent items noted in HPI and remainder of comprehensive ROS otherwise negative.   Objective:    General appearance: alert, cooperative and appears stated age Neck: no adenopathy, no carotid bruit, no JVD, supple, symmetrical, trachea midline and thyroid not enlarged, symmetric, no tenderness/mass/nodules Lungs: clear to auscultation bilaterally Breasts: normal appearance, no masses or tenderness Heart: regular rate and rhythm, S1, S2 normal, no murmur, click, rub or gallop Abdomen: soft, non-tender; bowel sounds normal; no masses,  no organomegaly Pelvic: cervix normal in appearance, external genitalia normal, no adnexal masses or tenderness, no cervical motion tenderness, rectovaginal septum normal, uterus normal size, shape, and consistency, vagina normal without discharge and increased white thin d/c noted at cervix- wetprep obtained-+clue, TNTC bacteria     Assessment:    Healthy female exam. BV, Overweight      Plan:  rx for tindamax sent in and counseled on BV RTC 1 year or prn.   See After Visit Summary for Counseling Recommendations

## 2015-12-20 LAB — CYTOLOGY - PAP

## 2016-06-01 ENCOUNTER — Encounter: Payer: Self-pay | Admitting: Obstetrics and Gynecology

## 2016-06-15 ENCOUNTER — Encounter: Payer: Self-pay | Admitting: Obstetrics and Gynecology

## 2016-08-03 ENCOUNTER — Encounter: Payer: Self-pay | Admitting: Obstetrics and Gynecology

## 2016-08-03 ENCOUNTER — Ambulatory Visit (INDEPENDENT_AMBULATORY_CARE_PROVIDER_SITE_OTHER): Payer: Medicaid Other | Admitting: Obstetrics and Gynecology

## 2016-08-03 VITALS — BP 106/77 | HR 76 | Ht 62.0 in | Wt 171.3 lb

## 2016-08-03 DIAGNOSIS — N920 Excessive and frequent menstruation with regular cycle: Secondary | ICD-10-CM

## 2016-08-03 DIAGNOSIS — E669 Obesity, unspecified: Secondary | ICD-10-CM

## 2016-08-03 DIAGNOSIS — N946 Dysmenorrhea, unspecified: Secondary | ICD-10-CM

## 2016-08-03 MED ORDER — PHENTERMINE HCL 37.5 MG PO TABS: 37.5000 mg | ORAL_TABLET | Freq: Every day | ORAL | 2 refills | Status: DC

## 2016-08-03 MED ORDER — CYANOCOBALAMIN 1000 MCG/ML IJ SOLN: 1000.0000 ug | INTRAMUSCULAR | 1 refills | Status: DC

## 2016-08-03 MED ORDER — ETONOGESTREL-ETHINYL ESTRADIOL 0.12-0.015 MG/24HR VA RING: VAGINAL_RING | VAGINAL | 12 refills | Status: DC

## 2016-08-03 NOTE — Progress Notes (Signed)
Subjective:  Cynthia Lara is a 26 y.o. G2P0 at Unknown being seen today for weight loss management- initial visit.  Patient reports General ROS: positive for  - fatigue and mild depression due to weight, also concerns about heavy and painful monthly menses since BTL. Desires some form of hormones to regulate it. .  Onset followed:   recent pregnancy,  Previous/Current treatment includes: small frequent feedings, nutritional supplement, vitamin supplement, .  The following portions of the patient's history were reviewed and updated as appropriate: allergies, current medications, past family history, past medical history, past social history, past surgical history and problem list.   Objective:   Vitals:   08/03/16 1421  BP: 106/77  Pulse: 76  Weight: 171 lb 4.8 oz (77.7 kg)  Height: 5\' 2"  (1.575 m)    General:  Alert, oriented and cooperative. Patient is in no acute distress.  :   :   :   :   :   :   PE: Well groomed female in no current distress,   Mental Status: Normal mood and affect. Normal behavior. Normal judgment and thought content.   Current BMI: Body mass index is 31.33 kg/m.   Assessment and Plan:  Obesity  1. Dysmenorrhea Will try Nuvaring, 1 sample given to place today, and RX sent in  2. Menorrhagia with regular cycle Nuvaring  3. Obesity (BMI 30.0-34.9)    Plan: low carb, High protein diet RX for adipex 37.5 mg daily and B12 1000mcg.ml monthly, to start now with first injection given at today's visit. Reviewed side-effects common to both medications and expected outcomes. Increase daily water intake to at least 8 bottle a day, every day.  Goal is to reduse weight by 10% by end of three months, and will re-evaluate then.  RTC in 4 weeks for Nurse visit to check weight & BP, and get next B12 injections.    Please refer to After Visit Summary for other counseling recommendations.    [name], CNM   [name],  CNM      Consider the Low Glycemic Index Diet and 6 smaller meals daily .  This boosts your metabolism and regulates your sugars:   Use the protein bar by Atkins because they have lots of fiber in them  Find the low carb flatbreads, tortillas and pita breads for sandwiches:  Joseph's makes a pita bread and a flat bread , available at [employer] and [employer]; Toufayah makes a low carb flatbread available at [employer] and HT that is 9 net carbs and 100 cal Mission makes a low carb whole wheat tortilla available at [employer],and most grocery stores with 6 net carbs and 210 cal  [place] yogurt can still have a lot of carbs .  Dannon Light N fit has 80 cal and 8 carbs

## 2016-08-03 NOTE — Patient Instructions (Addendum)
Thank you for enrolling in MyChart. Please follow the instructions below to securely access your online medical record. MyChart allows you to send messages to your doctor, view your test results, renew your prescriptions, schedule appointments, and more.  How Do I Sign Up? 1. In your Internet browser, go to [email or web address]. 2. Click on the New  User? link in the Sign In box.  3. Enter your MyChart Access Code exactly as it appears below. You will not need to use this code after you have completed the sign-up process. If you do not sign up before the expiration date, you must request a new code. MyChart Access Code: [email or web address] Expires: 10/02/2016  2:49 PM  4. Enter the last four digits of your Social Security Number (xxxx) and Date of Birth (mm/dd/yyyy) as indicated and click Next. You will be taken to the next sign-up page. 5. Create a MyChart ID. This will be your MyChart login ID and cannot be changed, so think of one that is secure and easy to remember. 6. Create a MyChart password. You can change your password at any time. 7. Enter your Password Reset Question and Answer and click Next. This can be used at a later time if you forget your password.  8. Select your communication preference, and if applicable enter your e-mail address. You will receive e-mail notification when new information is available in MyChart by choosing to receive e-mail notifications and filling in your e-mail. 9. Click Sign In. You can now view your medical record.   Additional Information If you have questions, you can email [email or web address] or call [phone] to talk to our MyChart staff. Remember, MyChart is NOT to be used for urgent needs. For medical emergencies, dial 911.    Consider the Low Glycemic Index Diet and 6 smaller meals daily .  This boosts your metabolism and regulates your sugars:   Use the protein bar by Atkins because they have lots of fiber in  them  Find the low carb flatbreads, tortillas and pita breads for sandwiches:  Joseph's makes a pita bread and a flat bread , available at [facility] and [employer]; Toufayah makes a low carb flatbread available at [employer] and HT that is 9 net carbs and 100 cal Mission makes a low carb whole wheat tortilla available at [employer],and most grocery stores with 6 net carbs and 210 cal  Aust[place] yogurt can still have a lot of carbs .  Dannon Light N fit has 80 cal and 8 carbs

## 2016-09-01 ENCOUNTER — Ambulatory Visit (INDEPENDENT_AMBULATORY_CARE_PROVIDER_SITE_OTHER): Payer: Self-pay | Admitting: Obstetrics and Gynecology

## 2016-09-01 VITALS — BP 108/77 | HR 83 | Ht 62.0 in | Wt 160.3 lb

## 2016-09-01 DIAGNOSIS — E669 Obesity, unspecified: Secondary | ICD-10-CM

## 2016-09-01 NOTE — Progress Notes (Signed)
Patient ID: Cynthia Lara, female   DOB: 08-Jul-1990, 26 y.o.   MRN: [id number] Pt presents for weight, B/P check.  No side effects of medication-Phentermine. Weight loss of  11 lbs.   Encouraged eating healthy and exercise.  Pt declined B-12 injection because last time it made her feel jittery. She had to sit in care for a while before going into pharmacy and had not started phentermine yet.

## 2016-10-06 ENCOUNTER — Ambulatory Visit: Payer: Self-pay | Admitting: Obstetrics and Gynecology

## 2016-10-06 VITALS — BP 117/79 | HR 103 | Ht 62.0 in | Wt 154.4 lb

## 2016-10-06 DIAGNOSIS — E669 Obesity, unspecified: Secondary | ICD-10-CM

## 2016-10-06 NOTE — Progress Notes (Signed)
Patient ID: Cynthia MarionAmber Lara, female   DOB: 23-Apr-1991, 26 y.o.   MRN: [id number] Pt presents for weight, B/P, B-12 injection. No side effects of medication-Phentermine.  Weight loss of 6 lbs. Encouraged eating healthy and exercise.  Pt states she is not taking B12 injections.

## 2016-10-23 ENCOUNTER — Encounter: Payer: Self-pay | Admitting: Obstetrics and Gynecology

## 2016-11-05 ENCOUNTER — Emergency Department
Admission: EM | Admit: 2016-11-05 | Discharge: 2016-11-05 | Disposition: A | Discharge status: Home / Self Care | Payer: Medicaid Other | Attending: Emergency Medicine | Admitting: Emergency Medicine

## 2016-11-05 ENCOUNTER — Encounter: Payer: Self-pay | Admitting: Emergency Medicine

## 2016-11-05 DIAGNOSIS — L03211 Cellulitis of face: Secondary | ICD-10-CM | POA: Insufficient documentation

## 2016-11-05 DIAGNOSIS — Z79899 Other long term (current) drug therapy: Secondary | ICD-10-CM | POA: Insufficient documentation

## 2016-11-05 MED ORDER — CEPHALEXIN 500 MG PO CAPS: 500.0000 mg | ORAL_CAPSULE | Freq: Four times a day (QID) | ORAL | 0 refills | Status: DC

## 2016-11-05 NOTE — ED Provider Notes (Signed)
[facility] Emergency Department Provider Note   ____________________________________________   First MD Initiated Contact with Patient 11/05/16 1118     (approximate)  I have reviewed the triage vital signs and the nursing notes.   HISTORY  Chief Complaint Facial Swelling    HPI Cynthia Lara is a 26 y.o. female is here with complaint of a bump on the right side of her face that began swelling more today. Patient denies any injury or known insect bite. She denies any fever or chills.She rates her pain as an 8 out of 10.   Past Medical History:  Diagnosis Date  . Overweight     There are no active problems to display for this patient.   Past Surgical History:  Procedure Laterality Date  . TUBAL LIGATION      Prior to Admission medications   Medication Sig Start Date End Date Taking? Authorizing Provider  cephALEXin (KEFLEX) 500 MG capsule Take 1 capsule (500 mg total) by mouth 4 (four) times daily. 11/05/16   Tommi Rumps, PA-C  etonogestrel-ethinyl estradiol (NUVARING) 0.12-0.015 MG/24HR vaginal ring Insert vaginally and leave in place for 3 consecutive weeks, then remove for 1 week. 08/03/16   Shambley, Melody N, CNM  Melatonin 10 MG CAPS Take by mouth.    [provider]  phentermine (ADIPEX-P) 37.5 MG tablet Take 1 tablet (37.5 mg total) by mouth daily before breakfast. 08/03/16   Shambley, Melody N, CNM    Allergies Patient has no known allergies.  Family History  Problem Relation Age of Onset  . Hypertension Mother   . Hypertension Father   . Diabetes Paternal Grandmother     Social History Social History  Substance Use Topics  . Smoking status: Never Smoker  . Smokeless tobacco: Never Used  . Alcohol use Yes     Comment: occas    Review of Systems  Constitutional: No fever/chills Eyes: No visual changes. ENT: No sore throat. Cardiovascular: Denies chest pain. Respiratory: Denies shortness of  breath. Skin: Positive for skin infection right sided mouth. Neurological: Negative for headaches, focal weakness or numbness.   ____________________________________________   PHYSICAL EXAM:  VITAL SIGNS: ED Triage Vitals  Enc Vitals Group     BP 11/05/16 1046 (!) 119/94     Pulse Rate 11/05/16 1046 (!) 108     Resp 11/05/16 1046 18     Temp 11/05/16 1046 98.4 F (36.9 C)     Temp Source 11/05/16 1046 Oral     SpO2 11/05/16 1046 100 %     Weight 11/05/16 1046 146 lb (66.2 kg)     Height 11/05/16 1046 5\' 1"  (1.549 m)     Head Circumference --      Peak Flow --      Pain Score 11/05/16 1045 9     Pain Loc --      Pain Edu? --      Excl. in GC? --     Constitutional: Alert and oriented. Well appearing and in no acute distress. Eyes: Conjunctivae are normal. PERRL. EOMI. Head: Atraumatic. Nose: No congestion/rhinnorhea. Mouth/Throat: Mucous membranes are moist.  Oropharynx non-erythematous. Neck: No stridor.   Hematological/Lymphatic/Immunilogical: No cervical lymphadenopathy. Cardiovascular: Normal rate, regular rhythm. Grossly normal heart sounds.  Good peripheral circulation. Respiratory: Normal respiratory effort.  No retractions. Lungs CTAB. Musculoskeletal: Moves upper and lower extremities without any difficulty and normal gait was noted. Neurologic:  Normal speech and language. No gross focal neurologic deficits are appreciated. Skin:  Skin is warm, dry and intact. On examination of the right upper corner of her mouth. There is a erythematous 1 cm area with a papule in the center. Area is tender to touch. There is no fluctuance to the area and appears to be an early cellulitis. No abscess at this time. Psychiatric: Mood and affect are normal. Speech and behavior are normal.  ____________________________________________   LABS (all labs ordered are listed, but only abnormal results are displayed)  Labs Reviewed - No data to display  PROCEDURES  Procedure(s)  performed: None  Procedures  Critical Care performed: No  ____________________________________________   INITIAL IMPRESSION / ASSESSMENT AND PLAN / ED COURSE  Pertinent labs & imaging results that were available during my care of the patient were reviewed by me and considered in my medical decision making (see chart for details).  Patient denies any previous history of MRSA. She is encouraged to use warm compresses to her face frequently and begin taking Keflex 500 mg 4 times a day. She is to take Tylenol or ibuprofen as needed for pain. She [facility] if any continued problems. She will also follow up with her PCP that is listed on her [place] access card.      ____________________________________________   FINAL CLINICAL IMPRESSION(S) / ED DIAGNOSES  Final diagnoses:  Facial cellulitis      NEW MEDICATIONS STARTED DURING THIS VISIT:  Discharge Medication List as of 11/05/2016 11:30 AM    START taking these medications   Details  cephALEXin (KEFLEX) 500 MG capsule Take 1 capsule (500 mg total) by mouth 4 (four) times daily., Starting Sun 11/05/2016, Print         Note:  This document was prepared using Dragon voice recognition software and may include unintentional dictation errors.    Tommi RumpsSummers, Radiah Lubinski L, PA-C 11/05/16 1631    Governor RooksLord, Rebecca, MD 11/16/16 [phone]

## 2016-11-05 NOTE — ED Notes (Signed)

## 2016-11-05 NOTE — Discharge Instructions (Signed)
Warm compresses to your face frequently. Begin taking antibiotics as directed today. Do not use any sharp objects or squeeze the right side of her face. You  may also take Tylenol or ibuprofen as needed for pain. [facility] if any continued problems. Also looking at your [facility] [place] access card for your primary care provider and make an appointment.

## 2016-11-05 NOTE — ED Triage Notes (Signed)
Pt had pimple to right side of mouth Friday and was hurting Saturday but has developed swelling here. No respiratory distress. Controlling secretions.

## 2016-11-08 ENCOUNTER — Encounter: Payer: Medicaid Other | Admitting: Obstetrics and Gynecology

## 2016-11-16 ENCOUNTER — Other Ambulatory Visit: Payer: Self-pay | Admitting: Emergency Medicine

## 2016-11-16 ENCOUNTER — Encounter: Payer: Medicaid Other | Admitting: Obstetrics and Gynecology

## 2016-11-17 ENCOUNTER — Other Ambulatory Visit: Payer: Self-pay | Admitting: Emergency Medicine

## 2016-11-18 ENCOUNTER — Encounter (HOSPITAL_COMMUNITY): Payer: Self-pay | Admitting: Emergency Medicine

## 2016-11-18 ENCOUNTER — Emergency Department (HOSPITAL_COMMUNITY)
Admission: EM | Admit: 2016-11-18 | Discharge: 2016-11-18 | Disposition: A | Discharge status: Home / Self Care | Payer: Medicaid Other | Attending: Emergency Medicine | Admitting: Emergency Medicine

## 2016-11-18 DIAGNOSIS — Z793 Long term (current) use of hormonal contraceptives: Secondary | ICD-10-CM | POA: Insufficient documentation

## 2016-11-18 DIAGNOSIS — L0291 Cutaneous abscess, unspecified: Secondary | ICD-10-CM

## 2016-11-18 DIAGNOSIS — K130 Diseases of lips: Secondary | ICD-10-CM | POA: Insufficient documentation

## 2016-11-18 MED ORDER — MUPIROCIN 2 % EX OINT: 1.0000 "application " | TOPICAL_OINTMENT | Freq: Three times a day (TID) | CUTANEOUS | 1 refills | Status: DC

## 2016-11-18 MED ORDER — DOXYCYCLINE HYCLATE 100 MG PO CAPS: 100.0000 mg | ORAL_CAPSULE | Freq: Two times a day (BID) | ORAL | 0 refills | Status: DC

## 2016-11-18 NOTE — ED Triage Notes (Signed)
Pt. Stated, I have an abscess on the right side of my lip for 2 days

## 2016-11-18 NOTE — Discharge Instructions (Signed)
Take the doxycycline twice a day for 7 days. Warm compresses to the area. Motrin and Tylenol for swelling and pain. Also use the antibiotic ointment with a Q-tip and applied to your nostrils 3 times a day for the next week. Also you may go to the store and find the chlorhexidine body wash. This is located in the first aid section next to the Betadine. Used this for the next 3 weeks and washed daily with it.     To find a primary care or specialty doctor please call [phone] or [phone] to access "[place] Find a Doctor Service."  You may also go on the [facility] website at [email or web address]  There are also multiple [facility] practices throughout the [place] that are frequently accepting new patients. You may find a clinic that is close to your home and contact them.  [facility] - [address] [place] [phone]  [facility] in [place] (also locations in [place] and [place]) - [address] [place] [phone]  [facility] - [address] [place] [phone]

## 2016-11-18 NOTE — ED Provider Notes (Signed)
[facility]-EMERGENCY DEPT Provider Note    By signing my name below, I, Earmon Phoenix, attest that this documentation has been prepared under the direction and in the presence of Demetrios Loll, PA-C. Electronically Signed: Earmon Phoenix, ED Scribe. 11/18/16. 10:48 AM.    History   Chief Complaint Chief Complaint  Patient presents with  . Abscess    The history is provided by the patient and medical records. No language interpreter was used.    Cynthia Lara is a 26 y.o. female who presents to the Emergency Department complaining of an abscess to the upper left lip that appeared two days ago. She reports associated pain. Pt was seen for similar symptoms two weeks ago at [facility] ED and was diagnosed with facial cellulitis of the left side of her face and was treated with Keflex. She has taken the full course of Keflex and finished five days ago with resolution of the symptoms. She has been applying warm compresses to the area. Touching the area increases the pain. She denies alleviating factors. She denies fever, chills, nausea, vomiting. She reports h/o abscess that began last year after giving birth.    Past Medical History:  Diagnosis Date  . Overweight     There are no active problems to display for this patient.   Past Surgical History:  Procedure Laterality Date  . TUBAL LIGATION      OB History    Gravida Para Term Preterm AB Living   2         2   SAB TAB Ectopic Multiple Live Births           2       Home Medications    Prior to Admission medications   Medication Sig Start Date End Date Taking? Authorizing Provider  cephALEXin (KEFLEX) 500 MG capsule Take 1 capsule (500 mg total) by mouth 4 (four) times daily. 11/05/16   Tommi Rumps, PA-C  etonogestrel-ethinyl estradiol (NUVARING) 0.12-0.015 MG/24HR vaginal ring Insert vaginally and leave in place for 3 consecutive weeks, then remove for 1 week. 08/03/16   Shambley, Melody N, CNM  Melatonin 10 MG CAPS  Take by mouth.    [provider]  phentermine (ADIPEX-P) 37.5 MG tablet Take 1 tablet (37.5 mg total) by mouth daily before breakfast. 08/03/16   Aura Camps, Melody N, CNM    Family History Family History  Problem Relation Age of Onset  . Hypertension Mother   . Hypertension Father   . Diabetes Paternal Grandmother     Social History Social History  Substance Use Topics  . Smoking status: Never Smoker  . Smokeless tobacco: Never Used  . Alcohol use Yes     Comment: occas     Allergies   Patient has no known allergies.   Review of Systems Review of Systems  Constitutional: Negative for chills and fever.  HENT: Positive for facial swelling.   Gastrointestinal: Negative for nausea and vomiting.  Skin: Positive for color change (abscess to upper left lip).     Physical Exam Updated Vital Signs BP 114/83 (BP Location: Right Arm)   Pulse 98   Temp 97.9 F (36.6 C) (Oral)   Resp 16   LMP 10/23/2016 (Approximate)   SpO2 97%   Physical Exam  Constitutional: She is oriented to person, place, and time. She appears well-developed and well-nourished.  HENT:  Head: Normocephalic and atraumatic.  Mouth/Throat: Oropharynx is clear and moist.  1 cm area of induration at left upper vermilion border.  TTP. Erythema and minimal warmth noted. No drainage. No area of fluctuance.  Neck: Normal range of motion.  Cardiovascular: Normal rate.   Pulmonary/Chest: Effort normal.  Musculoskeletal: Normal range of motion.  Neurological: She is alert and oriented to person, place, and time.  Skin: Skin is warm and dry. Capillary refill takes less than 2 seconds.  Psychiatric: She has a normal mood and affect. Her behavior is normal.  Nursing note and vitals reviewed.      ED Treatments / Results  DIAGNOSTIC STUDIES: Oxygen Saturation is 97% on RA, normal by my interpretation.   COORDINATION OF CARE: 10:38 AM- Will speak with attending physician about appropriate course of  treatment. Pt verbalizes understanding and agrees to plan.  10:47 AM- Will prescribe chlorhexidene cleanser and Doxycycline.    Medications - No data to display  Labs (all labs ordered are listed, but only abnormal results are displayed) Labs Reviewed - No data to display  EKG  EKG Interpretation None       Radiology No results found.  Procedures Procedures (including critical care time)  Medications Ordered in ED Medications - No data to display   Initial Impression / Assessment and Plan / ED Course  I have reviewed the triage vital signs and the nursing notes.  Pertinent labs & imaging results that were available during my care of the patient were reviewed by me and considered in my medical decision making (see chart for details).     Patient with skin abscess to left upper vermilion border. Patient has history of abscesses. The area is indurated but has no area of fluctuance. Do not feel that any incision and drainage would benefit patient and patient would not like any I&D performed on her face for cosmetic reasons. Incision and drainage not performed in the ED today due to location of abscess.  Supportive care and return precautions discussed. Pt sent home with prescription for Doxycycline to cover for MRSA and Chlorhexidine body wash to help with MRSA colonization . The patient appears reasonably screened and/or stabilized for discharge and I doubt any other emergent medical condition requiring further screening, evaluation, or treatment in the ED prior to discharge.    Final Clinical Impressions(s) / ED Diagnoses   Final diagnoses:  Abscess    New Prescriptions New Prescriptions   DOXYCYCLINE (VIBRAMYCIN) 100 MG CAPSULE    Take 1 capsule (100 mg total) by mouth 2 (two) times daily.   MUPIROCIN OINTMENT (BACTROBAN) 2 %    Apply 1 application topically 3 (three) times daily. Apply with q tip to your nostrils    I personally performed the services described in  this documentation, which was scribed in my presence. The recorded information has been reviewed and is accurate.      Rise MuLeaphart, Tashera Montalvo T, PA-C 11/18/16 1059    Lavera GuiseLiu, Dana Duo, MD 11/18/16 [phone]

## 2016-11-18 NOTE — ED Notes (Signed)
Pt has abscess on top left lip and left middle finger. Has healing lesion on right side of mouth. [facility] to [facility] for same. Was given abx but lesions have returned.

## 2016-11-23 ENCOUNTER — Ambulatory Visit (INDEPENDENT_AMBULATORY_CARE_PROVIDER_SITE_OTHER): Payer: Self-pay | Admitting: Obstetrics and Gynecology

## 2016-11-23 ENCOUNTER — Encounter: Payer: Self-pay | Admitting: Obstetrics and Gynecology

## 2016-11-23 VITALS — BP 109/70 | HR 88 | Ht 61.0 in | Wt 149.7 lb

## 2016-11-23 DIAGNOSIS — E663 Overweight: Secondary | ICD-10-CM

## 2016-11-23 MED ORDER — PHENTERMINE HCL 37.5 MG PO TABS: 37.5000 mg | ORAL_TABLET | Freq: Every day | ORAL | 2 refills | Status: DC

## 2016-11-23 NOTE — Progress Notes (Signed)
SUBJECTIVE:  26 y.o. here for follow-up weight loss visit, previously seen 4 weeks ago. Denies any concerns and feels like medication has worked well. . Has lost 22#s since starting medications in March 2018. Has been off medication about a week.is exercising for 30 minutes daily. Doesn't like the way B12 makes her feel  OBJECTIVE:  BP 109/70   Pulse 88   Ht 5\' 1"  (1.549 m)   Wt 149 lb 11.2 oz (67.9 kg)   LMP 10/23/2016   BMI 28.29 kg/m   Body mass index is 28.29 kg/m. Patient appears well. ASSESSMENT:  Overweight- responding well to weight loss plan  PLAN:  To restart current medications.  RTC in 12 weeks as she is not getting B12, and she agreed to send me monthly weights via MyChart.  Elbia Paro [place]amble[place], CNM

## 2016-12-15 ENCOUNTER — Encounter: Payer: Self-pay | Admitting: Obstetrics and Gynecology

## 2017-01-01 ENCOUNTER — Encounter: Payer: Self-pay | Admitting: Obstetrics and Gynecology

## 2017-01-04 ENCOUNTER — Encounter: Payer: Self-pay | Admitting: Emergency Medicine

## 2017-01-04 ENCOUNTER — Emergency Department
Admission: EM | Admit: 2017-01-04 | Discharge: 2017-01-04 | Disposition: A | Discharge status: Home / Self Care | Payer: Medicaid Other | Attending: Emergency Medicine | Admitting: Emergency Medicine

## 2017-01-04 DIAGNOSIS — Z79899 Other long term (current) drug therapy: Secondary | ICD-10-CM | POA: Insufficient documentation

## 2017-01-04 DIAGNOSIS — L03211 Cellulitis of face: Secondary | ICD-10-CM | POA: Insufficient documentation

## 2017-01-04 MED ORDER — CLINDAMYCIN HCL 300 MG PO CAPS: 300.0000 mg | ORAL_CAPSULE | Freq: Four times a day (QID) | ORAL | 0 refills | Status: AC

## 2017-01-04 MED ORDER — LIDOCAINE HCL (PF) 1 % IJ SOLN
5.0000 mL | Freq: Once | INTRAMUSCULAR | Status: AC
  Administered 2017-01-04: 5 mL
  Filled 2017-01-04: qty 5

## 2017-01-04 MED ORDER — CLINDAMYCIN PHOSPHATE 600 MG/4ML IJ SOLN
600.0000 mg | Freq: Once | INTRAMUSCULAR | Status: AC
  Administered 2017-01-04: 600 mg via INTRAMUSCULAR
  Filled 2017-01-04: qty 4

## 2017-01-04 NOTE — Discharge Instructions (Signed)
Take medication as prescribed. Return to emergency department if symptoms worsen and follow-up with PCP as needed.    Call for follow-up appointment with ENT as soon as you are able to schedule an appointment. If symptoms worsen prior to that appointment do not hesitate to return to emergency department.

## 2017-01-04 NOTE — ED Provider Notes (Signed)
[facility] Emergency Department Provider Note   ____________________________________________   I have reviewed the triage vital signs and the nursing notes.   HISTORY  Chief Complaint Cyst    HPI Cynthia Lara is a 26 y.o. female presents to the emergency department complaining of an abscess to the right lower lip that appeared yesterday. She reports associated pain and swelling. Pt reports similar episodes for which she was seen here several weeks ago and at [facility]. Patient reports being treated with a course of oral antibiotics. Patient noted resolution of symptoms following with episodes after completing entire course of antibiotics.  Patient denies fever, chills, headache, vision changes, chest pain, chest tightness, shortness of breath, abdominal pain, nausea and vomiting.  Past Medical History:  Diagnosis Date  . Overweight     There are no active problems to display for this patient.   Past Surgical History:  Procedure Laterality Date  . TUBAL LIGATION      Prior to Admission medications   Medication Sig Start Date End Date Taking? Authorizing Provider  clindamycin (CLEOCIN) 300 MG capsule Take 1 capsule (300 mg total) by mouth 4 (four) times daily. 01/04/17 01/14/17  Mardene Lessig M, PA-C  doxycycline (VIBRAMYCIN) 100 MG capsule Take 100 mg by mouth 2 (two) times daily.    [provider]  Melatonin 10 MG CAPS Take by mouth.    [provider]  mupirocin ointment (BACTROBAN) 2 % Apply 1 application topically 3 (three) times daily. Apply with q tip to your nostrils 11/18/16   Rise MuLeaphart, Kenneth T, PA-C  phentermine (ADIPEX-P) 37.5 MG tablet Take 1 tablet (37.5 mg total) by mouth daily before breakfast. 11/23/16   Aura CampsShambley, Melody N, CNM    Allergies Patient has no known allergies.  Family History  Problem Relation Age of Onset  . Hypertension Mother   . Hypertension Father   . Diabetes Paternal Grandmother      Social History Social History  Substance Use Topics  . Smoking status: Never Smoker  . Smokeless tobacco: Never Used  . Alcohol use Yes     Comment: occas    Review of Systems Constitutional: Negative for fever/chills Eyes: No visual changes. ENT:  Negative for sore throat and for difficulty swallowing Cardiovascular: Denies chest pain. Respiratory: Denies cough. Denies shortness of breath. Gastrointestinal: No abdominal pain.  No nausea, vomiting, diarrhea. Genitourinary: Negative for dysuria. Musculoskeletal: Negative for back pain. Skin: Negative for rash. Cellulitis and abscess along right lower lip.  Neurological: Negative for headaches.  ____________________________________________   PHYSICAL EXAM:  VITAL SIGNS: ED Triage Vitals  Enc Vitals Group     BP 01/04/17 2009 (!) 129/91     Pulse Rate 01/04/17 2009 93     Resp 01/04/17 2009 18     Temp 01/04/17 2009 98.3 F (36.8 C)     Temp Source 01/04/17 2009 Oral     SpO2 01/04/17 2009 98 %     Weight 01/04/17 2010 139 lb (63 kg)     Height 01/04/17 2010 5\' 1"  (1.549 m)     Head Circumference --      Peak Flow --      Pain Score 01/04/17 2009 9     Pain Loc --      Pain Edu? --      Excl. in GC? --     Constitutional: Alert and oriented. Well appearing and in no acute distress.  Eyes: Conjunctivae are normal. PERRL. EOMI  Head:  Normocephalic and atraumatic. ENT:      Ears: Canals clear. TMs intact bilaterally.      Nose: No congestion/rhinnorhea. Mouth/Throat: Mucous membranes are moist. 1.5 cm area of induration at right lower lip vermilion border. TTP. Erythema and minimal warmth noted. No drainage. Small area of fluctuance.  Neck:Supple. No thyromegaly. No stridor.  Cardiovascular: Normal rate, regular rhythm. Normal S1 and S2.  Good peripheral circulation. Respiratory: Normal respiratory effort without tachypnea or retractions. Lungs CTAB. No wheezes/rales/rhonchi. Good air entry to the bases with no  decreased or absent breath sounds. Hematological/Lymphatic/Immunological: No cervical lymphadenopathy. Cardiovascular: Normal rate, regular rhythm. Normal distal pulses. Gastrointestinal: Bowel sounds 4 quadrants. Soft and nontender to palpation Musculoskeletal: Nontender with normal range of motion in all extremities. Neurologic: Normal speech and language.  Skin:  Skin is warm, dry and intact. No rash noted. Right lower lip cellulitis with induration and very small area fluctuance.  Psychiatric: Mood and affect are normal. Speech and behavior are normal. Patient exhibits appropriate insight and judgement.  ____________________________________________   LABS (all labs ordered are listed, but only abnormal results are displayed)  Labs Reviewed - No data to display ____________________________________________  EKG none ____________________________________________  RADIOLOGY none ____________________________________________   PROCEDURES  Procedure(s) performed:  INCISION AND DRAINAGE Performed by: Clois Comber Consent: Verbal consent obtained. Risks and benefits: risks, benefits and alternatives were discussed Type: abscess  Body area: right lower lip  Anesthesia: local infiltration  Incision was made with a scalpel.  Local anesthetic: lidocaine 1%  Anesthetic total: 2.0 ml  Complexity: complex Blunt dissection to break up loculations  Drainage: purulent  Drainage amount: <0.5 ml, minimal.    Patient tolerance: Patient tolerated the procedure well with no immediate complications.      Critical Care performed: no ____________________________________________   INITIAL IMPRESSION / ASSESSMENT AND PLAN / ED COURSE  Pertinent labs & imaging results that were available during my care of the patient were reviewed by me and considered in my medical decision making (see chart for details).  Patient presents to emergency department with right lower lip  cellulitis. History, physical exam findings and I &D are reassuring of cellulitis. Small area of fluctuance noted on a exam however, minimal drainage obtained not allowing for wound culture. Antibiotics initiated during course of care in the emergency department, clindamycin 600 g IM. Patient will be prescribed course of clindamycin. Patient advised to follow up with PCP as needed or return to the emergency department if symptoms return or worsen. Patient informed of clinical course, understand medical decision-making process, and agree with plan.        ____________________________________________   FINAL CLINICAL IMPRESSION(S) / ED DIAGNOSES  Final diagnoses:  Facial cellulitis       NEW MEDICATIONS STARTED DURING THIS VISIT:  Discharge Medication List as of 01/04/2017 10:37 PM    START taking these medications   Details  clindamycin (CLEOCIN) 300 MG capsule Take 1 capsule (300 mg total) by mouth 4 (four) times daily., Starting Thu 01/04/2017, Until Sun 01/14/2017, Print         Note:  This document was prepared using Dragon voice recognition software and may include unintentional dictation errors.    Percell Boston 01/09/17 1248    Sharman Cheek, MD 01/14/17 2023

## 2017-01-04 NOTE — ED Triage Notes (Signed)
Pt ambulatory to triage in NAD, report swelling/cyst to right lower lip starting yesterday, reports pain, reports hx of similar.

## 2017-02-23 ENCOUNTER — Encounter: Payer: Self-pay | Admitting: Obstetrics and Gynecology

## 2017-06-08 ENCOUNTER — Telehealth: Payer: Self-pay

## 2017-06-08 NOTE — Telephone Encounter (Signed)
[facility] - New Patient Interview  Patient Information:  Maxwell Marionmber Thibeaux [address] [place] [id number] [phone] (home)    1. Have you ever been seen at the clinic before? No.  2. What do you need to be seen for? Primary Care  Note: We do not see patients for Physicals, Disability/ Medicaid Determinations, Mental Health [phone]), TB/STD Testing (Health Dept [phone]), Mammograms/Paps [facility] [phone]), Pregnancy (Health Dept [phone]), Pain of any kind (Ref to [facility] [phone] and [facility] [phone])  3.  Do you live in [place]? No.   Note: If No, they are not eligible for the clinic.  4. Do you have insurance (Medicare, Medicaid, [place], or any other form of insurance -  this includes Medicare Part A only) ? No.  Note: If Yes, they are not eligible for the clinic.  5. How do you support yourself? Are you working? Yes.    Note: Their monthly income (from a job, unemployment, [employer] surgeonsocial security, disability, child support, worker's compensation, etc.) cannot exceed $1,450. If they are slightly over this amount, please see Jolinda CroakLori, Tracy, or [facility] for approval.   -$ _____ per hour X _____ hours per week =  _____ -Total: _____ X 4.5 = _____   Note: If they have no income, they are eligible for the clinic with a notarized letter from the support person. If they cannot provide a support person, they will not be eligible for the clinic.    If the person is eligible for the clinic according to the information above, please go over the documentation they will need to bring in before they will be scheduled for an appointment.    Patient is not a [place] resident so she does not qualify to be seen at the clinic we [place] free [place].

## 2017-07-30 ENCOUNTER — Encounter: Payer: Self-pay | Admitting: Obstetrics and Gynecology

## 2017-08-31 ENCOUNTER — Encounter: Payer: Self-pay | Admitting: Obstetrics and Gynecology

## 2017-08-31 ENCOUNTER — Other Ambulatory Visit: Payer: Self-pay | Admitting: Obstetrics and Gynecology

## 2017-08-31 ENCOUNTER — Ambulatory Visit: Payer: Self-pay | Admitting: Obstetrics and Gynecology

## 2017-08-31 VITALS — BP 111/77 | HR 90 | Ht 61.0 in | Wt 170.8 lb

## 2017-08-31 DIAGNOSIS — Z113 Encounter for screening for infections with a predominantly sexual mode of transmission: Secondary | ICD-10-CM

## 2017-08-31 DIAGNOSIS — E669 Obesity, unspecified: Secondary | ICD-10-CM

## 2017-08-31 DIAGNOSIS — Z8249 Family history of ischemic heart disease and other diseases of the circulatory system: Secondary | ICD-10-CM

## 2017-08-31 DIAGNOSIS — Z01419 Encounter for gynecological examination (general) (routine) without abnormal findings: Secondary | ICD-10-CM

## 2017-08-31 NOTE — Patient Instructions (Signed)
Preventive Care 18-39 Years, Female Preventive care refers to lifestyle choices and visits with your health care provider that can promote health and wellness. What does preventive care include?  A yearly physical exam. This is also called an annual well check.  Dental exams once or twice a year.  Routine eye exams. Ask your health care provider how often you should have your eyes checked.  Personal lifestyle choices, including: ? Daily care of your teeth and gums. ? Regular physical activity. ? Eating a healthy diet. ? Avoiding tobacco and drug use. ? Limiting alcohol use. ? Practicing safe sex. ? Taking vitamin and mineral supplements as recommended by your health care provider. What happens during an annual well check? The services and screenings done by your health care provider during your annual well check will depend on your age, overall health, lifestyle risk factors, and family history of disease. Counseling Your health care provider may ask you questions about your:  Alcohol use.  Tobacco use.  Drug use.  Emotional well-being.  Home and relationship well-being.  Sexual activity.  Eating habits.  Work and work Statistician.  Method of birth control.  Menstrual cycle.  Pregnancy history.  Screening You may have the following tests or measurements:  Height, weight, and BMI.  Diabetes screening. This is done by checking your blood sugar (glucose) after you have not eaten for a while (fasting).  Blood pressure.  Lipid and cholesterol levels. These may be checked every 5 years starting at age 38.  Skin check.  Hepatitis C blood test.  Hepatitis B blood test.  Sexually transmitted disease (STD) testing.  BRCA-related cancer screening. This may be done if you have a family history of breast, ovarian, tubal, or peritoneal cancers.  Pelvic exam and Pap test. This may be done every 3 years starting at age 38. Starting at age 30, this may be done  every 5 years if you have a Pap test in combination with an HPV test.  Discuss your test results, treatment options, and if necessary, the need for more tests with your health care provider. Vaccines Your health care provider may recommend certain vaccines, such as:  Influenza vaccine. This is recommended every year.  Tetanus, diphtheria, and acellular pertussis (Tdap, Td) vaccine. You may need a Td booster every 10 years.  Varicella vaccine. You may need this if you have not been vaccinated.  HPV vaccine. If you are 39 or younger, you may need three doses over 6 months.  Measles, mumps, and rubella (MMR) vaccine. You may need at least one dose of MMR. You may also need a second dose.  Pneumococcal 13-valent conjugate (PCV13) vaccine. You may need this if you have certain conditions and were not previously vaccinated.  Pneumococcal polysaccharide (PPSV23) vaccine. You may need one or two doses if you smoke cigarettes or if you have certain conditions.  Meningococcal vaccine. One dose is recommended if you are age 68-21 years and a first-year college student living in a residence hall, or if you have one of several medical conditions. You may also need additional booster doses.  Hepatitis A vaccine. You may need this if you have certain conditions or if you travel or work in places where you may be exposed to hepatitis A.  Hepatitis B vaccine. You may need this if you have certain conditions or if you travel or work in places where you may be exposed to hepatitis B.  Haemophilus influenzae type b (Hib) vaccine. You may need this  if you have certain risk factors.  Talk to your health care provider about which screenings and vaccines you need and how often you need them. This information is not intended to replace advice given to you by your health care provider. Make sure you discuss any questions you have with your health care provider. Document Released: 07/11/2001 Document Revised:  02/02/2016 Document Reviewed: 03/16/2015 Elsevier Interactive Patient Education  2018 Elsevier Inc.  

## 2017-08-31 NOTE — Progress Notes (Signed)
Subjective:     Cynthia Lara is a single white  27 y.o. female and is here for a comprehensive physical exam. 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 G2P2 , s/p BTL. Not sexually active right now . The patient reports problems - weight gain after death of father and seperated from partner due to infidelity.exercises regularly walking.  Social History   Socioeconomic History  . Marital status: Single    Spouse name: Not on file  . Number of children: Not on file  . Years of education: Not on file  . Highest education level: Not on file  Occupational History  . Not on file  Social Needs  . Financial resource strain: Not on file  . Food insecurity:    Worry: Not on file    Inability: Not on file  . Transportation needs:    Medical: Not on file    Non-medical: Not on file  Tobacco Use  . Smoking status: Never Smoker  . Smokeless tobacco: Never Used  Substance and Sexual Activity  . Alcohol use: Yes    Comment: occas  . Drug use: No  . Sexual activity: Yes    Birth control/protection: Surgical  Lifestyle  . Physical activity:    Days per week: Not on file    Minutes per session: Not on file  . Stress: Not on file  Relationships  . Social connections:    Talks on phone: Not on file    Gets together: Not on file    Attends religious service: Not on file    Active member of club or organization: Not on file    Attends meetings of clubs or organizations: Not on file    Relationship status: Not on file  . Intimate partner violence:    Fear of current or ex partner: Not on file    Emotionally abused: Not on file    Physically abused: Not on file    Forced sexual activity: Not on file  Other Topics Concern  . Not on file  Social History Narrative  . Not on file   Health Maintenance  Topic Date Due  . TETANUS/TDAP  07/25/2009  . INFLUENZA VACCINE  12/27/2017  . PAP SMEAR  12/15/2018  . HIV Screening  Completed    The following portions of the patient's history were  reviewed and updated as appropriate: allergies, current medications, past family history, past medical history, past social history, past surgical history and problem list.  Review of Systems Pertinent items noted in HPI and remainder of comprehensive ROS otherwise negative.   Objective:    General appearance: alert, cooperative and appears stated age Neck: no adenopathy, no carotid bruit, no JVD, supple, symmetrical, trachea midline and thyroid not enlarged, symmetric, no tenderness/mass/nodules Lungs: clear to auscultation bilaterally Breasts: normal appearance, no masses or tenderness Heart: regular rate and rhythm, S1, S2 normal, no murmur, click, rub or gallop Abdomen: soft, non-tender; bowel sounds normal; no masses,  no organomegaly Pelvic: cervix normal in appearance, external genitalia normal, no adnexal masses or tenderness, no cervical motion tenderness, rectovaginal septum normal, uterus normal size, shape, and consistency and vagina normal without discharge    Assessment:    Healthy female exam. Family history of early MI in dad at age 27 Obesity      Plan:  Labs obtained - will follow up accordingly. Will restart weight loss program with B12 given today. RTC 1 month for wt/bp/b12  Harlow MaresMelody Abraham Margulies, CNM   See After Visit Summary  for Counseling Recommendations

## 2017-09-01 LAB — COMPREHENSIVE METABOLIC PANEL
A/G RATIO: 1.7 (ref 1.2–2.2)
ALBUMIN: 4.2 g/dL (ref 3.5–5.5)
ALK PHOS: 71 IU/L (ref 39–117)
ALT: 8 IU/L (ref 0–32)
AST: 10 IU/L (ref 0–40)
BILIRUBIN TOTAL: 0.4 mg/dL (ref 0.0–1.2)
BUN / CREAT RATIO: 18 (ref 9–23)
BUN: 12 mg/dL (ref 6–20)
CHLORIDE: 102 mmol/L (ref 96–106)
CO2: 23 mmol/L (ref 20–29)
CREATININE: 0.66 mg/dL (ref 0.57–1.00)
Calcium: 9.5 mg/dL (ref 8.7–10.2)
GFR calc Af Amer: 140 mL/min/{1.73_m2} (ref >59)
GFR calc non Af Amer: 121 mL/min/{1.73_m2} (ref >59)
GLOBULIN, TOTAL: 2.5 g/dL (ref 1.5–4.5)
Glucose: 74 mg/dL (ref 65–99)
Potassium: 4.5 mmol/L (ref 3.5–5.2)
SODIUM: 139 mmol/L (ref 134–144)
Total Protein: 6.7 g/dL (ref 6.0–8.5)

## 2017-09-01 LAB — LIPID PANEL
CHOLESTEROL TOTAL: 145 mg/dL (ref 100–199)
Chol/HDL Ratio: 2.5 ratio (ref 0.0–4.4)
HDL: 59 mg/dL (ref >39)
LDL CALC: 68 mg/dL (ref 0–99)
TRIGLYCERIDES: 91 mg/dL (ref 0–149)
VLDL CHOLESTEROL CAL: 18 mg/dL (ref 5–40)

## 2017-09-01 LAB — HEMOGLOBIN A1C
Est. average glucose Bld gHb Est-mCnc: 97 mg/dL
HEMOGLOBIN A1C: 5 % (ref 4.8–5.6)

## 2017-09-01 LAB — THYROID PANEL WITH TSH
Free Thyroxine Index: 2.1 (ref 1.2–4.9)
T3 Uptake Ratio: 29 % (ref 24–39)
T4, Total: 7.2 ug/dL (ref 4.5–12.0)
TSH: 1.29 u[IU]/mL (ref 0.450–4.500)

## 2017-09-01 LAB — RPR: RPR Ser Ql: NONREACTIVE

## 2017-09-01 LAB — HEPATITIS PANEL, ACUTE
HEP B S AG: NEGATIVE
Hep A IgM: NEGATIVE
Hep B C IgM: NEGATIVE
Hep C Virus Ab: <0.1 s/co ratio (ref 0.0–0.9)

## 2017-09-01 LAB — HIV ANTIBODY (ROUTINE TESTING W REFLEX): HIV Screen 4th Generation wRfx: NONREACTIVE

## 2017-09-03 ENCOUNTER — Other Ambulatory Visit: Payer: Self-pay | Admitting: *Deleted

## 2017-09-03 MED ORDER — PHENTERMINE HCL 37.5 MG PO TABS: 37.5000 mg | ORAL_TABLET | Freq: Every day | ORAL | 2 refills | Status: DC

## 2017-09-04 ENCOUNTER — Encounter: Payer: Self-pay | Admitting: Obstetrics and Gynecology

## 2017-09-04 ENCOUNTER — Other Ambulatory Visit: Payer: Self-pay | Admitting: Obstetrics and Gynecology

## 2017-09-06 LAB — CYTOLOGY - PAP

## 2018-06-17 ENCOUNTER — Emergency Department: Payer: Self-pay

## 2018-06-17 ENCOUNTER — Other Ambulatory Visit: Payer: Self-pay

## 2018-06-17 ENCOUNTER — Emergency Department
Admission: EM | Admit: 2018-06-17 | Discharge: 2018-06-18 | Disposition: A | Discharge status: Home / Self Care | Payer: Self-pay | Attending: Emergency Medicine | Admitting: Emergency Medicine

## 2018-06-17 DIAGNOSIS — R1013 Epigastric pain: Secondary | ICD-10-CM | POA: Insufficient documentation

## 2018-06-17 DIAGNOSIS — K8020 Calculus of gallbladder without cholecystitis without obstruction: Secondary | ICD-10-CM | POA: Insufficient documentation

## 2018-06-17 DIAGNOSIS — Z79899 Other long term (current) drug therapy: Secondary | ICD-10-CM | POA: Insufficient documentation

## 2018-06-17 DIAGNOSIS — K5900 Constipation, unspecified: Secondary | ICD-10-CM | POA: Insufficient documentation

## 2018-06-17 DIAGNOSIS — R112 Nausea with vomiting, unspecified: Secondary | ICD-10-CM | POA: Insufficient documentation

## 2018-06-17 LAB — COMPREHENSIVE METABOLIC PANEL
ALT: 13 U/L (ref 0–44)
ANION GAP: 5 (ref 5–15)
AST: 19 U/L (ref 15–41)
Albumin: 4.2 g/dL (ref 3.5–5.0)
Alkaline Phosphatase: 59 U/L (ref 38–126)
BILIRUBIN TOTAL: 0.6 mg/dL (ref 0.3–1.2)
BUN: 17 mg/dL (ref 6–20)
CO2: 27 mmol/L (ref 22–32)
Calcium: 9.2 mg/dL (ref 8.9–10.3)
Chloride: 105 mmol/L (ref 98–111)
Creatinine, Ser: 0.69 mg/dL (ref 0.44–1.00)
GFR calc non Af Amer: >60 mL/min (ref >60)
Glucose, Bld: 106 mg/dL — ABNORMAL HIGH (ref 70–99)
POTASSIUM: 3.7 mmol/L (ref 3.5–5.1)
Sodium: 137 mmol/L (ref 135–145)
TOTAL PROTEIN: 7.6 g/dL (ref 6.5–8.1)

## 2018-06-17 LAB — CBC
HCT: 38 % (ref 36.0–46.0)
HEMOGLOBIN: 12.4 g/dL (ref 12.0–15.0)
MCH: 28.4 pg (ref 26.0–34.0)
MCHC: 32.6 g/dL (ref 30.0–36.0)
MCV: 87 fL (ref 80.0–100.0)
PLATELETS: 257 10*3/uL (ref 150–400)
RBC: 4.37 MIL/uL (ref 3.87–5.11)
RDW: 12.2 % (ref 11.5–15.5)
WBC: 10.3 10*3/uL (ref 4.0–10.5)
nRBC: 0 % (ref 0.0–0.2)

## 2018-06-17 LAB — URINALYSIS, COMPLETE (UACMP) WITH MICROSCOPIC
Bacteria, UA: NONE SEEN
Bilirubin Urine: NEGATIVE
GLUCOSE, UA: NEGATIVE mg/dL
Hgb urine dipstick: NEGATIVE
Ketones, ur: NEGATIVE mg/dL
Leukocytes, UA: NEGATIVE
Nitrite: NEGATIVE
PH: 5 (ref 5.0–8.0)
PROTEIN: NEGATIVE mg/dL
Specific Gravity, Urine: 1.029 (ref 1.005–1.030)

## 2018-06-17 LAB — LIPASE, BLOOD: Lipase: 38 U/L (ref 11–51)

## 2018-06-17 LAB — POCT PREGNANCY, URINE: Preg Test, Ur: NEGATIVE

## 2018-06-17 LAB — TROPONIN I: Troponin I: <0.03 ng/mL (ref <0.03)

## 2018-06-17 MED ORDER — ONDANSETRON HCL 4 MG/2ML IJ SOLN
4.0000 mg | Freq: Once | INTRAMUSCULAR | Status: AC
  Administered 2018-06-17: 4 mg via INTRAVENOUS
  Filled 2018-06-17: qty 2

## 2018-06-17 MED ORDER — FAMOTIDINE IN NACL 20-0.9 MG/50ML-% IV SOLN
20.0000 mg | Freq: Once | INTRAVENOUS | Status: AC
  Administered 2018-06-17: 20 mg via INTRAVENOUS
  Filled 2018-06-17: qty 50

## 2018-06-17 MED ORDER — SODIUM CHLORIDE 0.9 % IV BOLUS
1000.0000 mL | Freq: Once | INTRAVENOUS | Status: AC
  Administered 2018-06-17: 1000 mL via INTRAVENOUS

## 2018-06-17 MED ORDER — SODIUM CHLORIDE 0.9% FLUSH
3.0000 mL | Freq: Once | INTRAVENOUS | Status: AC
  Administered 2018-06-17: 3 mL via INTRAVENOUS

## 2018-06-17 NOTE — ED Notes (Signed)
Pt states she has vomited 7 times since last night. Medial abdominal pain started Saturday and has gotten worse since then.

## 2018-06-17 NOTE — ED Provider Notes (Signed)
[facility] Emergency Department Provider Note  ____________________________________________  Time seen: Approximately 10:26 PM  I have reviewed the triage vital signs and the nursing notes.   HISTORY  Chief Complaint Abdominal Pain and Emesis   HPI Cynthia Lara is a 28 y.o. female with no significant past medical history who presents for evaluation of abdominal pain.  Patient reports a week of constipation.  For the last 3 days she has been having a sharp epigastric abdominal pain which is currently moderate in intensity.  She reports taking Dulcolax and today had a normal bowel movement.  She started having nausea and vomiting today.  She reports 7 episodes of nonbloody nonbilious emesis.  No fever or chills, no dysuria or hematuria.  She does not take NSAIDs.  Denies any alcohol use since Thanksgiving.  No history of peptic ulcer disease or GERD.  She has had a tubal ligation but no other abdominal surgeries.  No history of SBO.  Past Medical History:  Diagnosis Date  . Overweight     Past Surgical History:  Procedure Laterality Date  . TUBAL LIGATION      Prior to Admission medications   Medication Sig Start Date End Date Taking? Authorizing Provider  doxycycline (VIBRAMYCIN) 100 MG capsule Take 100 mg by mouth 2 (two) times daily.    [provider]  Melatonin 10 MG CAPS Take by mouth.    [provider]  mupirocin ointment (BACTROBAN) 2 % Apply 1 application topically 3 (three) times daily. Apply with q tip to your nostrils 11/18/16   Rise MuLeaphart, Kenneth T, PA-C  phentermine (ADIPEX-P) 37.5 MG tablet Take 1 tablet (37.5 mg total) by mouth daily before breakfast. 09/03/17   Sh[place], Melody N, CNM    Allergies Patient has no known allergies.  Family History  Problem Relation Age of Onset  . Hypertension Mother   . Hypertension Father   . Diabetes Paternal Grandmother     Social History Social History   Tobacco Use  .  Smoking status: Never Smoker  . Smokeless tobacco: Never Used  Substance Use Topics  . Alcohol use: Yes    Comment: occas  . Drug use: No    Review of Systems  Constitutional: Negative for fever. Eyes: Negative for visual changes. ENT: Negative for sore throat. Neck: No neck pain  Cardiovascular: Negative for chest pain. Respiratory: Negative for shortness of breath. Gastrointestinal: + epigastric abdominal pain, nausea, vomiting and constipation Genitourinary: Negative for dysuria. Musculoskeletal: Negative for back pain. Skin: Negative for rash. Neurological: Negative for headaches, weakness or numbness. Psych: No SI or HI  ____________________________________________   PHYSICAL EXAM:  VITAL SIGNS: ED Triage Vitals  Enc Vitals Group     BP 06/17/18 2024 127/80     Pulse Rate 06/17/18 2024 84     Resp 06/17/18 2024 17     Temp 06/17/18 2024 97.8 F (36.6 C)     Temp Source 06/17/18 2024 Oral     SpO2 06/17/18 2024 99 %     Weight 06/17/18 2026 170 lb (77.1 kg)     Height 06/17/18 2026 5\' 1"  (1.549 m)     Head Circumference --      Peak Flow --      Pain Score 06/17/18 2025 8     Pain Loc --      Pain Edu? --      Excl. in GC? --     Constitutional: Alert and oriented. Well appearing and in  no apparent distress. HEENT:      Head: Normocephalic and atraumatic.         Eyes: Conjunctivae are normal. Sclera is non-icteric.       Mouth/Throat: Mucous membranes are moist.       Neck: Supple with no signs of meningismus. Cardiovascular: Regular rate and rhythm. No murmurs, gallops, or rubs. 2+ symmetrical distal pulses are present in all extremities. No JVD. Respiratory: Normal respiratory effort. Lungs are clear to auscultation bilaterally. No wheezes, crackles, or rhonchi.  Gastrointestinal: Soft, epigastric tenderness to palpation, and non distended with positive bowel sounds. No rebound or guarding. Musculoskeletal: Nontender with normal range of motion in all  extremities. No edema, cyanosis, or erythema of extremities. Neurologic: Normal speech and language. Face is symmetric. Moving all extremities. No gross focal neurologic deficits are appreciated. Skin: Skin is warm, dry and intact. No rash noted. Psychiatric: Mood and affect are normal. Speech and behavior are normal.  ____________________________________________   LABS (all labs ordered are listed, but only abnormal results are displayed)  Labs Reviewed  COMPREHENSIVE METABOLIC PANEL - Abnormal; Notable for the following components:      Result Value   Glucose, Bld 106 (*)    All other components within normal limits  URINALYSIS, COMPLETE (UACMP) WITH MICROSCOPIC - Abnormal; Notable for the following components:   Color, Urine YELLOW (*)    APPearance CLEAR (*)    All other components within normal limits  LIPASE, BLOOD  CBC  TROPONIN I  POC URINE PREG, ED  POCT PREGNANCY, URINE   ____________________________________________  EKG  ED ECG REPORT I, Nita Sickle, the attending physician, personally viewed and interpreted this ECG.  Normal sinus rhythm, rate of 73, normal intervals, borderline right axis deviation, no ST elevations or depressions.  No prior for compare ____________________________________________  RADIOLOGY  I have personally reviewed the images performed during this visit and I agree with the Radiologist's read.   Interpretation by Radiologist:  Dg Abdomen 1 View  Result Date: 06/17/2018 CLINICAL DATA:  Initial evaluation for acute abdominal pain, emesis. EXAM: ABDOMEN - 1 VIEW COMPARISON:  Prior radiograph from 11/23/2012. FINDINGS: Bowel gas pattern within normal limits without obstruction or ileus. No abnormal bowel wall thickening. Moderate retained stool within the right colon. No soft tissue mass or abnormal calcification. No appreciable free air on these limited supine views the abdomen. Visualized lung bases are clear. Visualized osseous  structures unremarkable. IMPRESSION: 1. Nonobstructive bowel gas pattern with no radiographic evidence for acute intra-abdominal pathology. 2. Moderate retained stool within the right colon, which could reflect constipation. Electronically Signed   By: Rise Mu M.D.   On: 06/17/2018 22:20     ____________________________________________   PROCEDURES  Procedure(s) performed: None Procedures Critical Care performed:  None ____________________________________________   INITIAL IMPRESSION / ASSESSMENT AND PLAN / ED COURSE   28 y.o. female with no significant past medical history who presents for evaluation of epigastric abdominal pain, nausea and vomiting.  DDX gastritis, PUD, pancreatitis, GB disease, SOB, constipation.  Patient is well-appearing and in no distress with normal vital signs, abdomen is soft and nondistended with positive bowel sounds, mild epigastric tenderness with no rebound or guarding, no right upper quadrant tenderness, negative Murphy sign.  Labs including CBC, CMP, lipase, urinalysis and pregnancy tests are all negative.  KUB consistent with moderate right colon stool concerning for constipation, nonobstructive bowel gas pattern.  Right upper quadrant ultrasound is pending.  Will treat patient with Zofran, IV fluids, and Pepcid.  _________________________ 11:37 PM on 06/17/2018 -----------------------------------------  Ultrasound pending.  Care transferred to Dr. Pershing ProudSchaevitz   As part of my medical decision making, I reviewed the following data within the electronic MEDICAL RECORD NUMBER Nursing notes reviewed and incorporated, Labs reviewed , EKG interpreted , Old chart reviewed, Radiograph reviewed , Notes from prior ED visits and [place] Controlled Substance Database    Pertinent labs & imaging results that were available during my care of the patient were reviewed by me and considered in my medical decision making (see chart for  details).    ____________________________________________   FINAL CLINICAL IMPRESSION(S) / ED DIAGNOSES  Final diagnoses:  Epigastric abdominal pain  Non-intractable vomiting with nausea, unspecified vomiting type  Constipation, unspecified constipation type      NEW MEDICATIONS STARTED DURING THIS VISIT:  ED Discharge Orders    None       Note:  This document was prepared using Dragon voice recognition software and may include unintentional dictation errors.    Nita SickleVeronese, [place], MD 06/17/18 [phone]

## 2018-06-17 NOTE — ED Triage Notes (Signed)
Pt arrives to ED via POV from home with c/o abdominal pain since Saturday. Pt also reports emesis starting today. Pt reports non-radiating epigastric pain, no SHOB. Pt reports 7-8 episodes of emesis in the last 24 hrs. No fever, no diarrhea (reports h/x of issues with constipation).

## 2018-06-17 NOTE — ED Notes (Signed)
EKG completed. Pt leaving for imaging.

## 2018-06-17 NOTE — Discharge Instructions (Addendum)
Constipation: Take colace twice a day everyday. Take senna once a day at bedtime. Take daily probiotics. Drink plenty of fluids and eat a diet rich in fiber. If you go more than 3 days without a bowel movement, take 1 cap full of Miralax in the morning and one in the evening up to 5 days.  ° °You have been seen in the Emergency Department (ED) for abdominal pain.  Your evaluation did not identify a clear cause of your symptoms but was generally reassuring. ° °Abdominal pain has many possible causes. Some aren't serious and get better on their own in a few days. Others need more testing and treatment. If your pain continues or gets worse, you need to be rechecked and may need more tests to find out what is wrong. You may need surgery to correct the problem.  ° °Follow up with your doctor in 12-24 hours if you are still having abdominal pain. Otherwise follow up in 1-3 days for a re-check ° °Don't ignore new symptoms, such as fever, nausea and vomiting, new or worsening abdominal pain, urination problems, bloody diarrhea or bloody stools, black tarry stools, uncontrollable nausea and vomiting, and dizziness. These may be signs of a more serious problem. If you develop any of these you should be seen by your doctor immediately or return to the ED. ° ° °How can you care for yourself at home?  °Rest until you feel better.  °To prevent dehydration, drink plenty of fluids, enough so that your urine is light yellow or clear like water. Choose water and other caffeine-free clear liquids until you feel better. If you have kidney, heart, or liver disease and have to limit fluids, talk with your doctor before you increase the amount of fluids you drink.  °If your stomach is upset, eat mild foods, such as rice, dry toast or crackers, bananas, and applesauce. Try eating several small meals instead of two or three large ones.  °Wait until 48 hours after all symptoms have gone away before you have spicy foods, alcohol, and drinks  that contain caffeine.  °Do not eat foods that are high in fat.  °Avoid anti-inflammatory medicines such as aspirin, ibuprofen (Advil, Motrin), and naproxen (Aleve). These can cause stomach upset. Talk to your doctor if you take daily aspirin for another health problem. ° °When should you call for help?  °Call 911 anytime you think you may need emergency care. For example, call if:  °You passed out (lost consciousness).  °You pass maroon or very bloody stools.  °You vomit blood or what looks like coffee grounds.  °You have new, severe belly pain. ° °Call your doctor now or seek immediate medical care if:  °Your pain gets worse, especially if it becomes focused in one area of your belly.  °You have a new or higher fever.  °Your stools are black and look like tar, or they have streaks of blood.  °You have unexpected vaginal bleeding.  °You have symptoms of a urinary tract infection. These may include:  °Pain when you urinate.  °Urinating more often than usual.  °Blood in your urine. °You are dizzy or lightheaded, or you feel like you may faint. °Watch closely for changes in your health, and be sure to contact your doctor if:  °You are not getting better after 1 day (24 hours). ° ° °

## 2018-06-18 MED ORDER — OXYCODONE-ACETAMINOPHEN 5-325 MG PO TABS: 1.0000 | ORAL_TABLET | ORAL | 0 refills | Status: DC | PRN

## 2018-06-18 NOTE — ED Notes (Signed)
Pt watching tv. Denies any needs.

## 2018-06-18 NOTE — ED Provider Notes (Signed)
Signout from Dr. Don Perking in this 28 year old female presented emergency department epigastric pain.  Possible gastritis but pending right upper quadrant ultrasound.  Physical Exam  BP 108/64   Pulse 73   Temp 97.8 F (36.6 C) (Oral)   Resp 19   Ht 5\' 1"  (1.549 m)   Wt 77.1 kg   SpO2 98%   BMI 32.12 kg/m   Physical Exam Patient at this time is without distress.  Right upper quadrant tenderness with a negative Murphy sign.  Abdomen is soft. ED Course/Procedures     Procedures  MDM  Patient with a finding of cholelithiasis on the ultrasound of the right upper quadrant.  I gave her the option for admission to hospital for pain control versus discharge.  She said that she would prefer discharge and she is able to eat.  White count normal.  Reassuring vital signs.  Will discharge with Percocet and surgical follow-up.  Patient given strict return precautions for any worsening concerning symptoms to return to the hospital especially right upper quadrant abdominal pain, nausea vomiting or fever.  Patient understand the diagnosis well treatment plan and willing to comply.       Myrna Blazer, MD 06/18/18 0111

## 2018-06-25 ENCOUNTER — Ambulatory Visit: Payer: Self-pay | Admitting: Surgery

## 2018-07-22 ENCOUNTER — Encounter: Payer: Self-pay | Admitting: Emergency Medicine

## 2018-07-22 ENCOUNTER — Other Ambulatory Visit: Payer: Self-pay

## 2018-07-22 ENCOUNTER — Emergency Department
Admission: EM | Admit: 2018-07-22 | Discharge: 2018-07-22 | Disposition: A | Discharge status: Home / Self Care | Payer: Self-pay | Attending: Emergency Medicine | Admitting: Emergency Medicine

## 2018-07-22 ENCOUNTER — Other Ambulatory Visit: Payer: Self-pay | Admitting: Obstetrics and Gynecology

## 2018-07-22 ENCOUNTER — Telehealth: Payer: Self-pay | Admitting: Obstetrics and Gynecology

## 2018-07-22 DIAGNOSIS — Z3202 Encounter for pregnancy test, result negative: Secondary | ICD-10-CM

## 2018-07-22 DIAGNOSIS — Z3201 Encounter for pregnancy test, result positive: Secondary | ICD-10-CM

## 2018-07-22 DIAGNOSIS — O3680X0 Pregnancy with inconclusive fetal viability, not applicable or unspecified: Secondary | ICD-10-CM

## 2018-07-22 DIAGNOSIS — Z9851 Tubal ligation status: Secondary | ICD-10-CM

## 2018-07-22 DIAGNOSIS — N644 Mastodynia: Secondary | ICD-10-CM

## 2018-07-22 DIAGNOSIS — R110 Nausea: Secondary | ICD-10-CM

## 2018-07-22 LAB — HCG, QUANTITATIVE, PREGNANCY: hCG, Beta Chain, Quant, S: <1 m[IU]/mL (ref <5)

## 2018-07-22 LAB — POCT PREGNANCY, URINE: Preg Test, Ur: NEGATIVE

## 2018-07-22 NOTE — ED Provider Notes (Signed)
[facility] Emergency Department Provider Note  ____________________________________________  Time seen: Approximately 11:28 PM  I have reviewed the triage vital signs and the nursing notes.   HISTORY  Chief Complaint Possible Pregnancy    HPI Cynthia Lara is a 28 y.o. female who presents to the emergency department for pregnancy testing.  She states that she had a positive pregnancy test at home.  She is concerned because she had her tubes tied about 5 years ago.  She has experienced some nausea and breast tenderness and swelling over the past few days.  Last menstrual cycle was 06/23/2018.  Past Medical History:  Diagnosis Date  . Overweight     There are no active problems to display for this patient.   Past Surgical History:  Procedure Laterality Date  . TUBAL LIGATION      Prior to Admission medications   Medication Sig Start Date End Date Taking? Authorizing Provider  Melatonin 10 MG CAPS Take by mouth.    [provider]    Allergies Patient has no known allergies.  Family History  Problem Relation Age of Onset  . Hypertension Mother   . Hypertension Father   . Diabetes Paternal Grandmother     Social History Social History   Tobacco Use  . Smoking status: Never Smoker  . Smokeless tobacco: Never Used  Substance Use Topics  . Alcohol use: Yes    Comment: occas  . Drug use: No    Review of Systems Constitutional: Negative for fever. Respiratory: Negative for shortness of breath or cough. Gastrointestinal: Negative for abdominal pain; positive for nausea , negative for vomiting. Genitourinary: Negative for dysuria , negative for vaginal discharge. Musculoskeletal: Negative for back pain. Skin: Negative for acute skin changes/rash/lesion.  Positive for bilateral breast tenderness ____________________________________________   PHYSICAL EXAM:  VITAL SIGNS: ED Triage Vitals  Enc Vitals Group     BP 07/22/18  1815 129/76     Pulse Rate 07/22/18 1815 79     Resp 07/22/18 1815 18     Temp 07/22/18 1815 98.6 F (37 C)     Temp Source 07/22/18 1815 Oral     SpO2 07/22/18 1815 100 %     Weight 07/22/18 1817 170 lb (77.1 kg)     Height 07/22/18 1817 5\' 1"  (1.549 m)     Head Circumference --      Peak Flow --      Pain Score 07/22/18 1817 0     Pain Loc --      Pain Edu? --      Excl. in GC? --     Constitutional: Alert and oriented. Well appearing and in no acute distress. Eyes: Conjunctivae are normal. Head: Atraumatic. Nose: No congestion/rhinnorhea. Mouth/Throat: Mucous membranes are moist. Respiratory: Normal respiratory effort.  No retractions. Gastrointestinal: Bowel sounds active x 4; Abdomen is soft without rebound or guarding. Genitourinary: Pelvic exam: Not indicated Musculoskeletal: No extremity tenderness nor edema.  Neurologic:  Normal speech and language. No gross focal neurologic deficits are appreciated. Speech is normal. No gait instability. Skin:  Skin is warm, dry and intact. No rash noted on exposed skin. Psychiatric: Mood and affect are normal. Speech and behavior are normal.  ____________________________________________   LABS (all labs ordered are listed, but only abnormal results are displayed)  Labs Reviewed  HCG, QUANTITATIVE, PREGNANCY  POCT PREGNANCY, URINE   ____________________________________________  RADIOLOGY  Not indicated ____________________________________________  Procedures  ____________________________________________  28 year old female presents to the emergency  department for pregnancy confirmation.  Here, both her serum beta hCG and point-of-care testing are negative for pregnancy.  Patient is to be discharged home for follow-up with gynecology or primary care.  She is to return to the emergency department for symptoms of concern if unable to schedule appointment.  INITIAL IMPRESSION / ASSESSMENT AND PLAN / ED COURSE  Pertinent  labs & imaging results that were available during my care of the patient were reviewed by me and considered in my medical decision making (see chart for details).  ____________________________________________   FINAL CLINICAL IMPRESSION(S) / ED DIAGNOSES  Final diagnoses:  Encounter for pregnancy test with result negative  Breast tenderness in female  Nausea    Note:  This document was prepared using Dragon voice recognition software and may include unintentional dictation errors.    Chinita Pester, FNP 07/22/18 2330    Phineas Semen, MD 07/22/18 [phone]

## 2018-07-22 NOTE — ED Triage Notes (Signed)
Pt presents to ED via POV with c/o positive pregnancy test. Pt states had tubal ligation in 2015. Pt c/o swollen breasts, nausea, and other "pregnancy symptoms".States took a home test last night and it was positive.

## 2018-07-22 NOTE — Telephone Encounter (Signed)
Lost patient on phone attempt to reach her back. Advise self pay rate with need amount patient would be able to pay today. Patient at this time has no money to pay toward appointment. Patient wishes to go to the ER to follow up on location of pregnancy

## 2018-07-22 NOTE — ED Notes (Signed)
Urine sent to the lab

## 2018-07-22 NOTE — ED Notes (Signed)
See triage note  States she took a pregnancy test at home and it was positive    Stats she has had her tubes tied 5 years ago   States she hashahd some nausea and swollen breast

## 2018-07-22 NOTE — Discharge Instructions (Signed)
Please follow up with gynecology for symptoms that change or worsen if not improving or if you miss your menstrual cycle.

## 2018-07-22 NOTE — Telephone Encounter (Signed)
Patient was calling for appointment due to HX of BTL. Attempt to schedule appointment. Lost patient on phone twice. Called patient and advise per AMS patient needs ultrasound and labs and follow up. Patient is self pay. Went to tell patient amount got disconnected again. Will try to reach out once more

## 2018-07-24 ENCOUNTER — Encounter: Payer: Self-pay | Admitting: Obstetrics and Gynecology

## 2018-09-13 ENCOUNTER — Telehealth: Payer: Self-pay | Admitting: Obstetrics and Gynecology

## 2018-09-13 NOTE — Telephone Encounter (Signed)
I called the patient and lvm for her to call office and schedule weight management appointment in 8 wks. Thank you.

## 2018-09-13 NOTE — Telephone Encounter (Signed)
The patient called and stated that she is interested in starting the weight management program. Would you like me to schedule the apt 8 wks out or schedule a tele-visit? Thank you.

## 2018-09-13 NOTE — Telephone Encounter (Signed)
Schedule out 8 weeks, thanks

## 2018-10-20 ENCOUNTER — Telehealth: Payer: Self-pay | Admitting: Family

## 2018-10-20 DIAGNOSIS — R300 Dysuria: Secondary | ICD-10-CM

## 2018-10-20 MED ORDER — CEPHALEXIN 500 MG PO CAPS: 500.0000 mg | ORAL_CAPSULE | Freq: Two times a day (BID) | ORAL | 0 refills | Status: DC

## 2018-10-20 NOTE — Progress Notes (Signed)

## 2018-11-01 ENCOUNTER — Encounter: Payer: Self-pay | Admitting: Obstetrics and Gynecology

## 2018-11-01 ENCOUNTER — Ambulatory Visit: Payer: Self-pay | Admitting: Obstetrics and Gynecology

## 2018-11-01 ENCOUNTER — Other Ambulatory Visit: Payer: Self-pay

## 2018-11-01 VITALS — BP 122/74 | HR 63 | Ht 61.0 in | Wt 173.2 lb

## 2018-11-01 DIAGNOSIS — Z7689 Persons encountering health services in other specified circumstances: Secondary | ICD-10-CM

## 2018-11-01 DIAGNOSIS — E669 Obesity, unspecified: Secondary | ICD-10-CM

## 2018-11-01 DIAGNOSIS — Z6832 Body mass index (BMI) 32.0-32.9, adult: Secondary | ICD-10-CM

## 2018-11-01 MED ORDER — CYANOCOBALAMIN 1000 MCG/ML IJ SOLN: 1000.0000 ug | INTRAMUSCULAR | 1 refills | Status: DC

## 2018-11-01 MED ORDER — PHENTERMINE HCL 37.5 MG PO TABS: 37.5000 mg | ORAL_TABLET | Freq: Every day | ORAL | 2 refills | Status: DC

## 2018-11-01 NOTE — Patient Instructions (Signed)

## 2018-11-01 NOTE — Progress Notes (Signed)
Subjective:  Jadin Weier is a 28 y.o. G2P0 at Unknown being seen today for weight loss management- initial visit.  Patient reports General ROS: negative and reports previous weight loss attempts: have been successful in the past with adipex.  Desires weight loss for tubal reversal. Goal is to lose 25 3s so she can have surgery.   The patient has a surgical history of:BTL   The following portions of the patient's history were reviewed and updated as appropriate: allergies, current medications, past family history, past medical history, past social history, past surgical history and problem list.   Objective:   Vitals:   11/01/18 0948  BP: 122/74  Pulse: 63  Weight: 173 lb 3.2 oz (78.6 kg)  Height: 5\' 1"  (1.549 m)    General:  Alert, oriented and cooperative. Patient is in no acute distress.  :   :   :   :   :   :   PE: Well groomed female in no current distress,   Mental Status: Normal mood and affect. Normal behavior. Normal judgment and thought content.   Current BMI: Body mass index is 32.73 kg/m.   Assessment and Plan:  Obesity  There are no diagnoses linked to this encounter.  Plan: low carb, High protein diet RX for adipex 37.5 mg daily and B12 .ml monthly, to start now with first injection given at today's visit. Reviewed side-effects common to both medications and expected outcomes. Increase daily water intake to at least 8 bottle a day, every day.  Goal is to reduse weight by 10% by end of three months, and will re-evaluate then.  RTC in 4 weeks for Nurse visit to check weight & BP, and get next B12 injections.    Please refer to After Visit Summary for other counseling recommendations.    [place], Melody N, CNM   Melody Isabella, CNM      Consider the Low Glycemic Index Diet and 6 smaller meals daily .  This boosts your metabolism and regulates your sugars:   Use the protein bar by [employer] because they have lots of fiber in  them  Find the low carb flatbreads, tortillas and pita breads for sandwiches:  Joseph's makes a pita bread and a flat bread , available at [facility] and [employer]; Toufayah makes a low carb flatbread available at [employer] and [employer] that is 9 net carbs and 100 cal [place] makes a low carb whole wheat tortilla available at [employer] most grocery stores with 6 net carbs and 210 cal  [place] yogurt can still have a lot of carbs .  Dannon Light N fit has 80 cal and 8 carbs

## 2018-11-11 ENCOUNTER — Encounter: Payer: Self-pay | Admitting: *Deleted

## 2018-11-21 ENCOUNTER — Telehealth: Payer: Self-pay | Admitting: Physician Assistant

## 2018-11-21 DIAGNOSIS — M545 Low back pain, unspecified: Secondary | ICD-10-CM

## 2018-11-21 DIAGNOSIS — R39198 Other difficulties with micturition: Secondary | ICD-10-CM

## 2018-11-21 DIAGNOSIS — R630 Anorexia: Secondary | ICD-10-CM

## 2018-11-21 DIAGNOSIS — R5383 Other fatigue: Secondary | ICD-10-CM

## 2018-11-21 NOTE — Progress Notes (Signed)
Based on what you shared with me, I feel your condition warrants further evaluation and I recommend that you be seen for a face to face office visit.  Giving the difficulty passing urine, decreased appetite and fatigue associated with back pain, this raises concern of a potential infection or pelvic cause of the back pain. As such you need a good examination and potentially testing of urine, etc to make sure you get the correct diagnoses and most appropriate treatment.   NOTE: If you entered your credit card information for this eVisit, you will not be charged. You may see a "hold" on your card for the $35 but that hold will drop off and you will not have a charge processed.  If you are having a true medical emergency please call 911.     For an urgent face to face visit, [place] has five urgent care centers for your convenience:    [email or web address] to reserve your spot online an avoid wait times  [facility] [address], [address] [place], [place] [address] Modified hours of operation: Monday-Friday, 12 PM to 6 PM  Closed Saturday & Sunday  *Across the street from [place] (New Address!) [address] [place], [place] [address] *Just off [address], across the road from [place] hours of operation: Monday-Friday, 12 PM to 6 PM  Closed Saturday & Sunday   The following sites will take your insurance:  . [facility]    [phone]                  Get Driving Directions  [address], [place] [address] . 10 am to 8 pm Monday-Friday . 12 pm to 8 pm Saturday-Sunday   . [facility] at [place]                  Get Driving Directions  [address], [place], [place] [address] . 8 am to 8 pm Monday-Friday . 9 am to 6 pm Saturday . 11 am to 6 pm Sunday   . [facility] at [place]                  Get Driving Directions   [address] [place], [place] [address] . 8 am to 8 pm Monday-Friday . 8 am to 4 pm Saturday-Sunday    . [facility] at [place]                    Get Driving Directions  [phone]  [address], [place], [place] [address]  . Monday-Friday, 12 PM to 6 PM    Your e-visit answers were reviewed by a board certified advanced clinical practitioner to complete your personal care plan.  Thank you for using e-Visits.

## 2019-02-13 ENCOUNTER — Encounter: Payer: Self-pay | Admitting: Obstetrics and Gynecology

## 2019-03-19 IMAGING — CR DG ABDOMEN 1V
1 series · 2 of 2 positions shown · non-contrast
Comparison: Prior radiograph from 11/23/2012.

CLINICAL DATA: Initial evaluation for acute abdominal pain, emesis.

EXAM:
ABDOMEN - 1 VIEW

[Series 1: dg abd 1 view · 0.14mm/px · 2 of 2 slices shown]
[im 1/2]
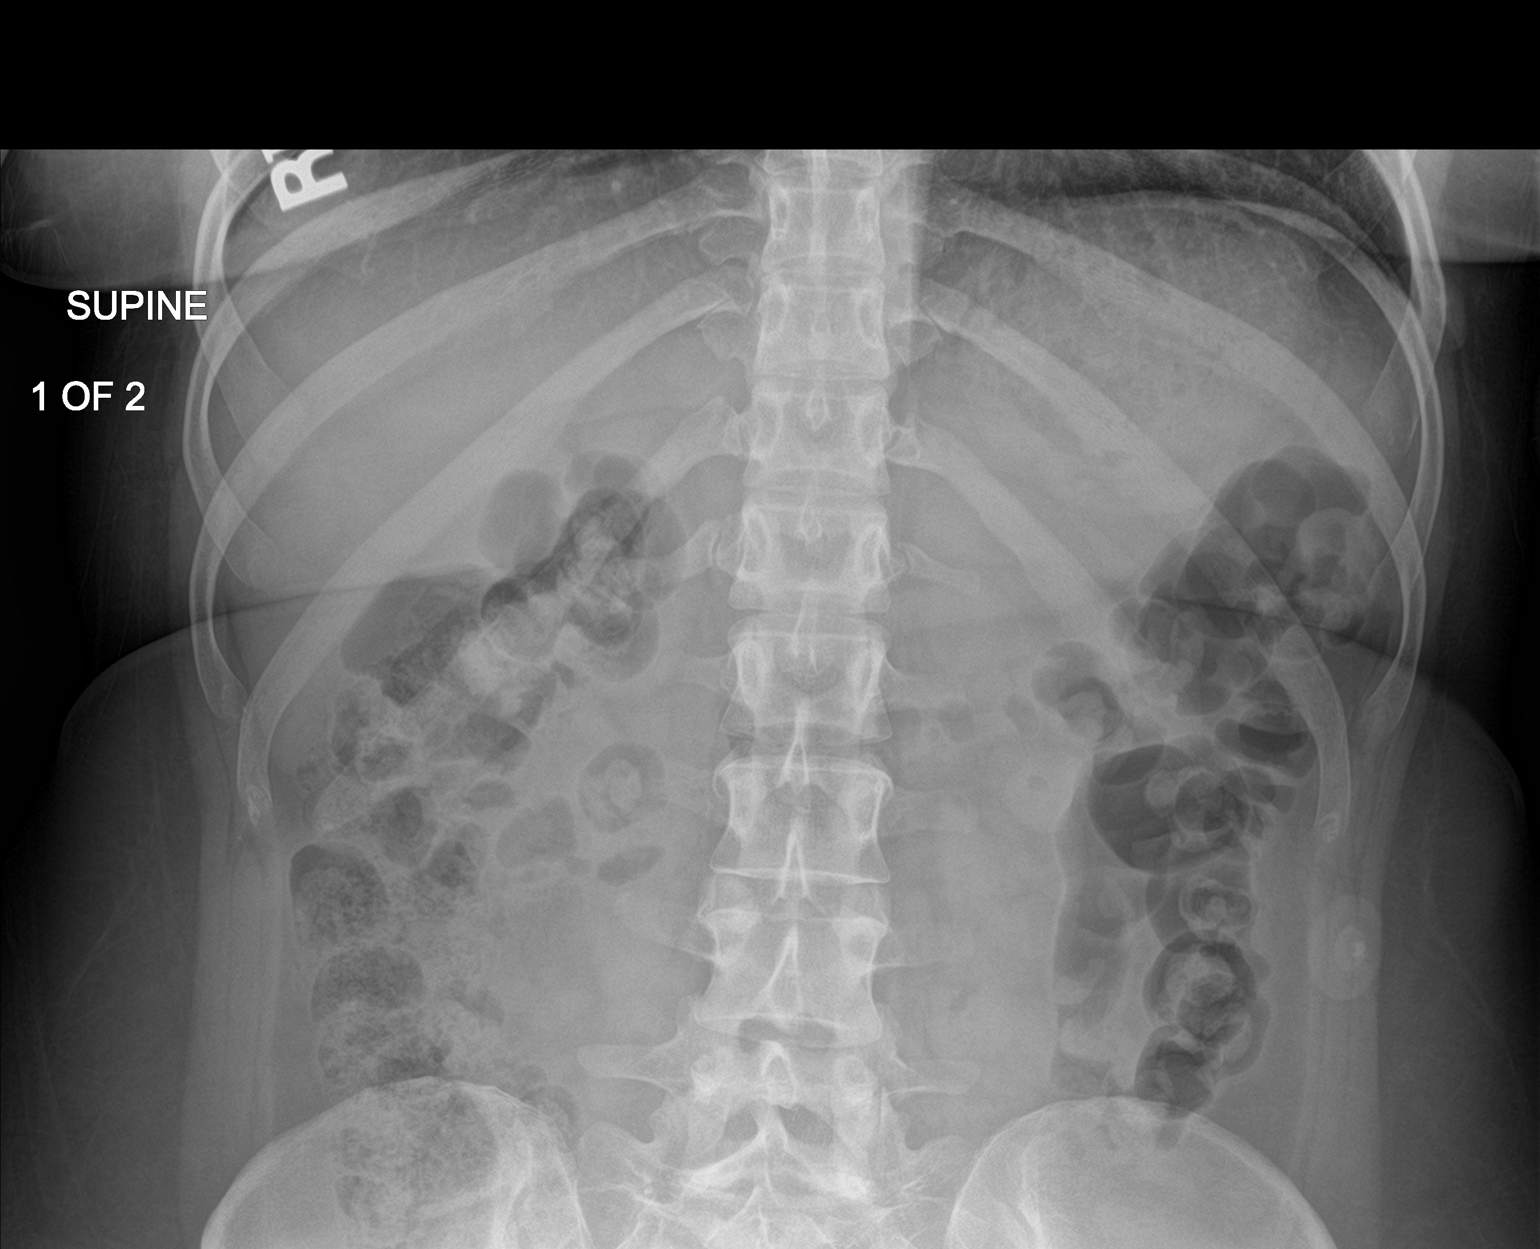
[im 2/2]
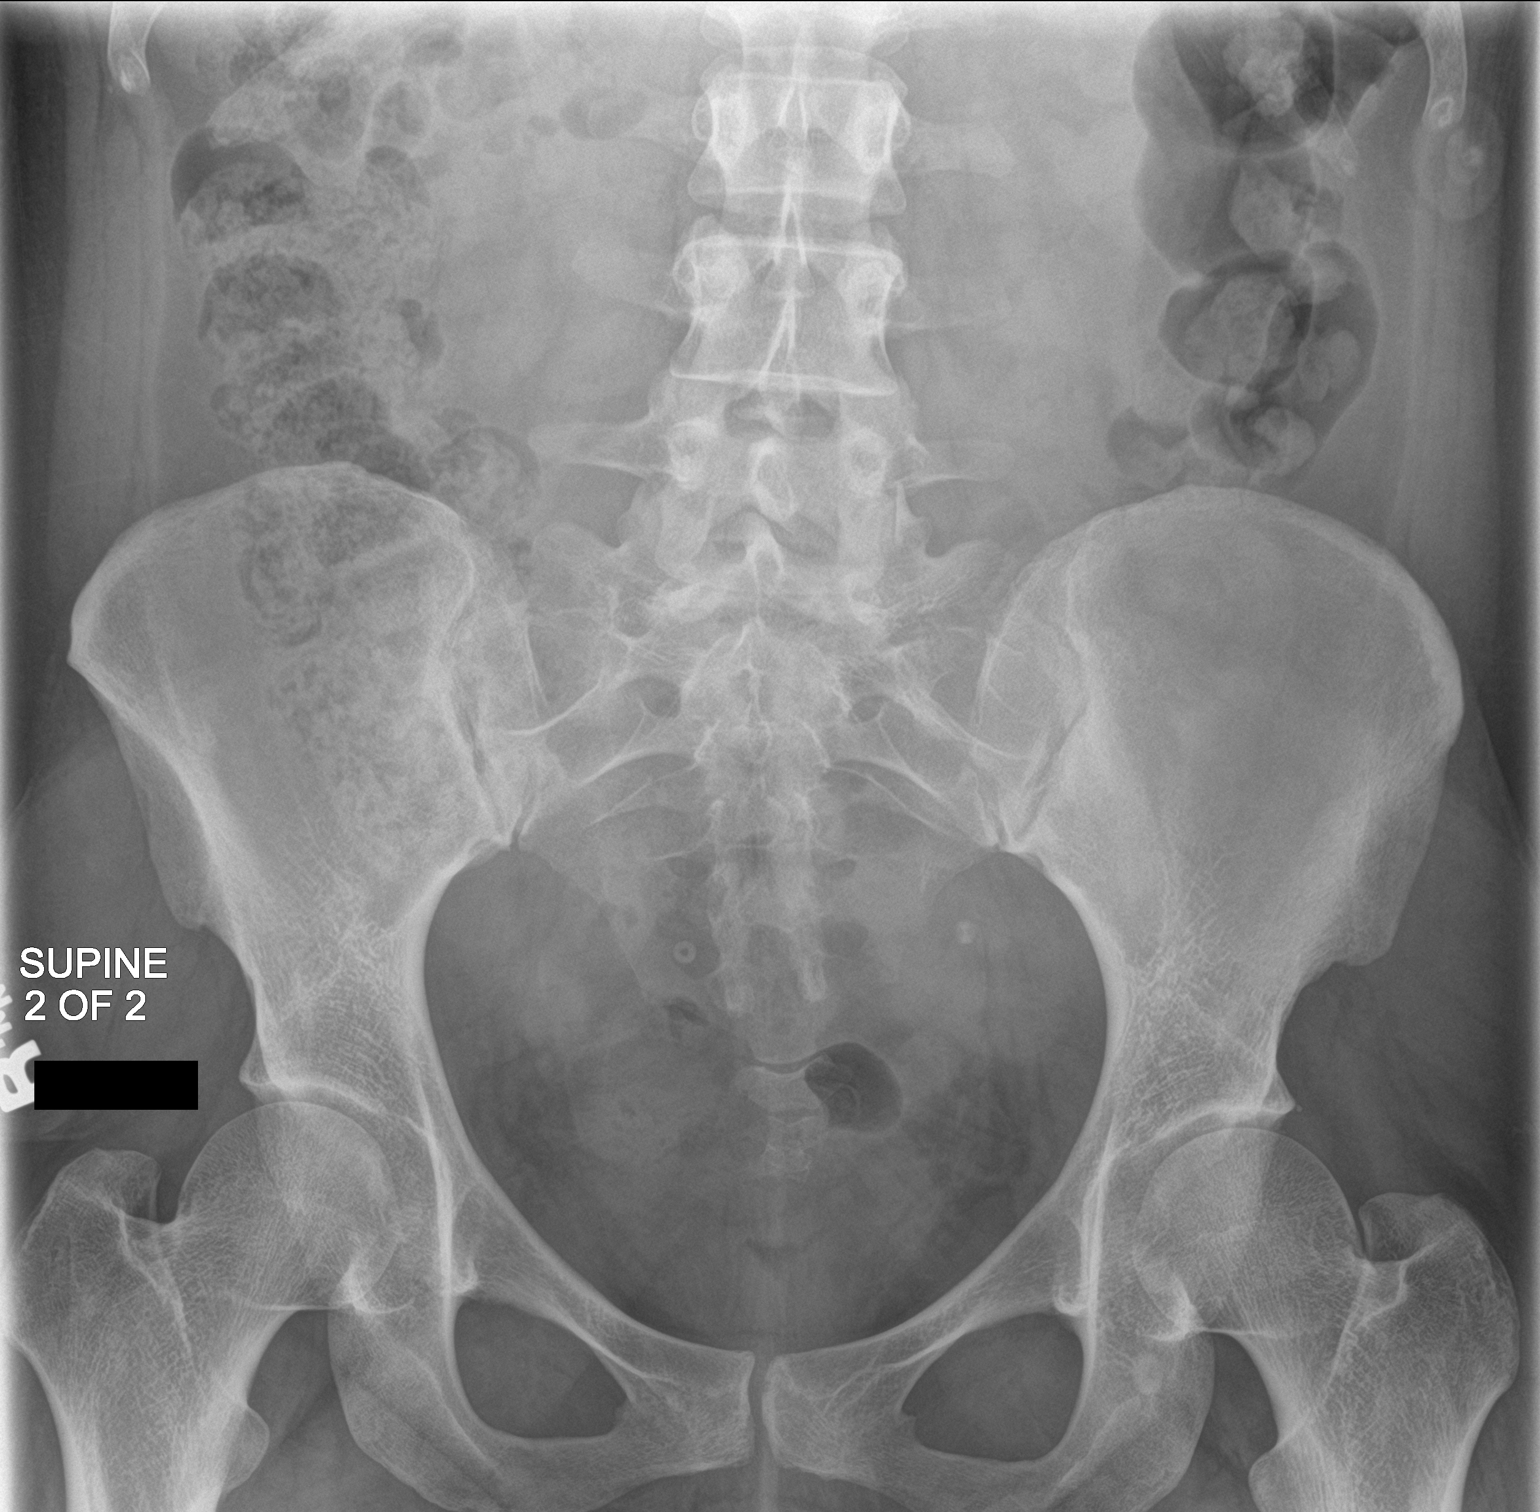

[2 of 2 positions shown; findings below may reference images not displayed]

FINDINGS: Bowel gas pattern within normal limits without obstruction or ileus.
No abnormal bowel wall thickening. Moderate retained stool within
the right colon. No soft tissue mass or abnormal calcification. No
appreciable free air on these limited supine views the abdomen.

Visualized lung bases are clear.

Visualized osseous structures unremarkable.
IMPRESSION: 1. Nonobstructive bowel gas pattern with no radiographic evidence
for acute intra-abdominal pathology.
2. Moderate retained stool within the right colon, which could
reflect constipation.

## 2019-04-22 NOTE — Progress Notes (Signed)
Greater than 5 minutes, yet less than 10 minutes of time have been spent researching, coordinating, and implementing care for this patient today.  Thank you for the details you included in the comment boxes. Those details are very helpful in determining the best course of treatment for you and help us to provide the best care.  

## 2019-06-30 ENCOUNTER — Ambulatory Visit: Payer: Self-pay

## 2019-06-30 NOTE — Telephone Encounter (Addendum)
Patient called stating that she was a patient at [facility] and was positive for COVID-19.  She states that  Her O2 sat was 92%. When transferring her to office patient disconnected call. Attempted to contact patient and left VM to return call to [phone] for assistance.    Called [facility] office and informed  Them that the call was dropped.  They states she was not a patient at their office.

## 2019-07-14 ENCOUNTER — Encounter: Payer: Self-pay | Admitting: *Deleted

## 2019-07-14 ENCOUNTER — Emergency Department
Admission: EM | Admit: 2019-07-14 | Discharge: 2019-07-15 | Disposition: A | Discharge status: Home / Self Care | Payer: Self-pay | Attending: Emergency Medicine | Admitting: Emergency Medicine

## 2019-07-14 ENCOUNTER — Emergency Department: Payer: Self-pay

## 2019-07-14 ENCOUNTER — Other Ambulatory Visit: Payer: Self-pay

## 2019-07-14 ENCOUNTER — Ambulatory Visit: Payer: Self-pay

## 2019-07-14 DIAGNOSIS — R079 Chest pain, unspecified: Secondary | ICD-10-CM | POA: Insufficient documentation

## 2019-07-14 DIAGNOSIS — Z8616 Personal history of COVID-19: Secondary | ICD-10-CM | POA: Insufficient documentation

## 2019-07-14 DIAGNOSIS — Z79899 Other long term (current) drug therapy: Secondary | ICD-10-CM | POA: Insufficient documentation

## 2019-07-14 LAB — BASIC METABOLIC PANEL
Anion gap: 6 (ref 5–15)
BUN: 18 mg/dL (ref 6–20)
CO2: 29 mmol/L (ref 22–32)
Calcium: 9.4 mg/dL (ref 8.9–10.3)
Chloride: 103 mmol/L (ref 98–111)
Creatinine, Ser: 0.62 mg/dL (ref 0.44–1.00)
GFR calc Af Amer: >60 mL/min (ref >60)
GFR calc non Af Amer: >60 mL/min (ref >60)
Glucose, Bld: 109 mg/dL — ABNORMAL HIGH (ref 70–99)
Potassium: 3.2 mmol/L — ABNORMAL LOW (ref 3.5–5.1)
Sodium: 138 mmol/L (ref 135–145)

## 2019-07-14 LAB — CBC
HCT: 36.9 % (ref 36.0–46.0)
Hemoglobin: 12.4 g/dL (ref 12.0–15.0)
MCH: 28.4 pg (ref 26.0–34.0)
MCHC: 33.6 g/dL (ref 30.0–36.0)
MCV: 84.4 fL (ref 80.0–100.0)
Platelets: 216 10*3/uL (ref 150–400)
RBC: 4.37 MIL/uL (ref 3.87–5.11)
RDW: 12.6 % (ref 11.5–15.5)
WBC: 11.1 10*3/uL — ABNORMAL HIGH (ref 4.0–10.5)
nRBC: 0 % (ref 0.0–0.2)

## 2019-07-14 LAB — HEPATIC FUNCTION PANEL
ALT: 10 U/L (ref 0–44)
AST: 15 U/L (ref 15–41)
Albumin: 3.9 g/dL (ref 3.5–5.0)
Alkaline Phosphatase: 51 U/L (ref 38–126)
Bilirubin, Direct: 0.1 mg/dL (ref 0.0–0.2)
Indirect Bilirubin: 0.7 mg/dL (ref 0.3–0.9)
Total Bilirubin: 0.8 mg/dL (ref 0.3–1.2)
Total Protein: 7.4 g/dL (ref 6.5–8.1)

## 2019-07-14 LAB — TROPONIN I (HIGH SENSITIVITY)
Troponin I (High Sensitivity): <2 ng/L (ref <18)
Troponin I (High Sensitivity): <2 ng/L (ref <18)

## 2019-07-14 LAB — LIPASE, BLOOD: Lipase: 24 U/L (ref 11–51)

## 2019-07-14 LAB — FIBRIN DERIVATIVES D-DIMER (ARMC ONLY): Fibrin derivatives D-dimer (ARMC): 270.91 ng/mL (FEU) (ref 0.00–499.00)

## 2019-07-14 MED ORDER — ACETAMINOPHEN 325 MG PO TABS
650.0000 mg | ORAL_TABLET | Freq: Once | ORAL | Status: AC
  Administered 2019-07-14: 650 mg via ORAL
  Filled 2019-07-14: qty 2

## 2019-07-14 MED ORDER — KETOROLAC TROMETHAMINE 30 MG/ML IJ SOLN
15.0000 mg | Freq: Once | INTRAMUSCULAR | Status: AC
  Administered 2019-07-14: 15 mg via INTRAVENOUS
  Filled 2019-07-14: qty 1

## 2019-07-14 MED ORDER — SODIUM CHLORIDE 0.9% FLUSH: 3.0000 mL | Freq: Once | INTRAVENOUS | Status: DC

## 2019-07-14 NOTE — Discharge Instructions (Addendum)
You had no evidence of blood clot or heart attack.  You can take Tylenol 1 g every 8 hours and ibuprofen 600 every 6 hours with food over the next week.  Return the ER if develop worsening shortness of breath or any other concerns

## 2019-07-14 NOTE — ED Provider Notes (Signed)
-----------------------------------------   11:14 PM on 07/14/2019 -----------------------------------------  Blood pressure 116/70, pulse 80, temperature 98.3 F (36.8 C), temperature source Oral, resp. rate 18, height 5\' 1"  (1.549 m), weight 79.4 kg, last menstrual period 06/25/2019, SpO2 99 %.  Assuming care from Dr. 06/27/2019.  In short, Cynthia Lara is a 29 y.o. female with a chief complaint of Chest Pain .  Refer to the original H&P for additional details.  The current plan of care is to follow-up d-dimer for patient with chest pain following recent COVID.  ----------------------------------------- 12:07 AM on 07/15/2019 -----------------------------------------  D-dimer within normal limits, doubt PE.  Repeat troponin is also within normal limits and symptoms are very atypical for ACS.  Patient reports feeling better and she is appropriate for discharge home at this time.  I have counseled her to establish care with a PCP and otherwise return to the ED for new or worsening symptoms.  Patient agrees with plan.    07/17/2019, MD 07/15/19 [phone]

## 2019-07-14 NOTE — ED Provider Notes (Signed)
[facility] Emergency Department Provider Note  ____________________________________________   First MD Initiated Contact with Patient 07/14/19 2213     (approximate)  I have reviewed the triage vital signs and the nursing notes.   HISTORY  Chief Complaint Chest Pain    HPI Cynthia Lara is a 29 y.o. female with Covid at the end of January who comes in with chest pain.  Patient reports chest pain on the left side of her chest and into her shoulder.  Occasionally it is pleuritic in nature but also somewhat worse with palpation.  Denies having this previously.  Not really feeling too short of breath.  States the pain has been there since this morning, constant, nothing makes it better, nothing makes it worse.  Denies any other risk factors for PE          Past Medical History:  Diagnosis Date  . Overweight     There are no problems to display for this patient.   Past Surgical History:  Procedure Laterality Date  . TUBAL LIGATION      Prior to Admission medications   Medication Sig Start Date End Date Taking? Authorizing Provider  cephALEXin (KEFLEX) 500 MG capsule Take 1 capsule (500 mg total) by mouth 2 (two) times daily. Patient not taking: Reported on 11/01/2018 10/20/18   Dutch Quint B, FNP  cyanocobalamin (,VITAMIN B-12,) 1000 MCG/ML injection Inject 1 mL (1,000 mcg total) into the muscle every 30 (thirty) days. 11/01/18   Shambley, Melody N, CNM  Melatonin 10 MG CAPS Take by mouth.    [provider]  phentermine (ADIPEX-P) 37.5 MG tablet Take 1 tablet (37.5 mg total) by mouth daily before breakfast. 11/01/18   Shambley, Melody N, CNM    Allergies Patient has no known allergies.  Family History  Problem Relation Age of Onset  . Hypertension Mother   . Hypertension Father   . Diabetes Paternal Grandmother     Social History Social History   Tobacco Use  . Smoking status: Never Smoker  . Smokeless tobacco: Never Used    Substance Use Topics  . Alcohol use: Not Currently    Comment: occas  . Drug use: No      Review of Systems Constitutional: No fever/chills Eyes: No visual changes. ENT: No sore throat. Cardiovascular: Positive chest pain Respiratory: Denies shortness of breath. Gastrointestinal: No abdominal pain.  No nausea, no vomiting.  No diarrhea.  No constipation. Genitourinary: Negative for dysuria. Musculoskeletal: Negative for back pain. Skin: Negative for rash. Neurological: Negative for headaches, focal weakness or numbness. All other ROS negative ____________________________________________   PHYSICAL EXAM:  VITAL SIGNS: ED Triage Vitals  Enc Vitals Group     BP 07/14/19 1902 105/61     Pulse Rate 07/14/19 1902 76     Resp 07/14/19 1902 18     Temp 07/14/19 1902 98.3 F (36.8 C)     Temp Source 07/14/19 1902 Oral     SpO2 07/14/19 1902 100 %     Weight 07/14/19 1904 175 lb (79.4 kg)     Height 07/14/19 1904 5\' 1"  (1.549 m)     Head Circumference --      Peak Flow --      Pain Score 07/14/19 1904 4     Pain Loc --      Pain Edu? --      Excl. in [place]? --     Constitutional: Alert and oriented. Well appearing and in no acute  distress. Eyes: Conjunctivae are normal. EOMI. Head: Atraumatic. Nose: No congestion/rhinnorhea. Mouth/Throat: Mucous membranes are moist.   Neck: No stridor. Trachea Midline. FROM Cardiovascular: Normal rate, regular rhythm. Grossly normal heart sounds.  Good peripheral circulation.  Mild pain with palpation on the chest wall Respiratory: Normal respiratory effort.  No retractions. Lungs CTAB. Gastrointestinal: Soft and nontender. No distention. No abdominal bruits.  Musculoskeletal: No lower extremity tenderness nor edema.  No joint effusions. Neurologic:  Normal speech and language. No gross focal neurologic deficits are appreciated.  Skin:  Skin is warm, dry and intact. No rash noted. Psychiatric: Mood and affect are normal. Speech and  behavior are normal. GU: Deferred   ____________________________________________   LABS (all labs ordered are listed, but only abnormal results are displayed)  Labs Reviewed  BASIC METABOLIC PANEL - Abnormal; Notable for the following components:      Result Value   Potassium 3.2 (*)    Glucose, Bld 109 (*)    All other components within normal limits  CBC - Abnormal; Notable for the following components:   WBC 11.1 (*)    All other components within normal limits  FIBRIN DERIVATIVES D-DIMER ([facility] ONLY)  HEPATIC FUNCTION PANEL  LIPASE, BLOOD  TROPONIN I (HIGH SENSITIVITY)  TROPONIN I (HIGH SENSITIVITY)   ____________________________________________   ED ECG REPORT I, Concha Se, the attending physician, personally viewed and interpreted this ECG.  EKG is normal sinus rate of 74, no ST elevation, T wave inversion lead II and aVL that appear new, normal intervals ____________________________________________  RADIOLOGY Vela Prose, personally viewed and evaluated these images (plain radiographs) as part of my medical decision making, as well as reviewing the written report by the radiologist.  ED MD interpretation: No pneumonia  Official radiology report(s): DG Chest 2 View  Result Date: 07/14/2019 CLINICAL DATA:  Chest pain EXAM: CHEST - 2 VIEW COMPARISON:  None. FINDINGS: The heart size and mediastinal contours are within normal limits. Both lungs are clear. The visualized skeletal structures are unremarkable. IMPRESSION: No active cardiopulmonary disease. Electronically Signed   By: Deatra Robinson M.D.   On: 07/14/2019 19:38    ____________________________________________   PROCEDURES  Procedure(s) performed (including Critical Care):  Procedures   ____________________________________________   INITIAL IMPRESSION / ASSESSMENT AND PLAN / ED COURSE   Cynthia Lara was evaluated in Emergency Department on 07/14/2019 for the symptoms described in the  history of present illness. She was evaluated in the context of the global COVID-19 pandemic, which necessitated consideration that the patient might be at risk for infection with the SARS-CoV-2 virus that causes COVID-19. Institutional protocols and algorithms that pertain to the evaluation of patients at risk for COVID-19 are in a state of rapid change based on information released by regulatory bodies including the CDC and federal and state organizations. These policies and algorithms were followed during the patient's care in the ED.    Most Likely DDx:  -MSK (atypical chest pain) but will get cardiac markers to evaluate for ACS given risk factors/age -Discussed with patient low suspicion for PE but given the pleuritic chest pain in the recent Covid we elected to get D-dimer  DDx that was also considered d/t potential to cause harm, but was found less likely based on history and physical (as detailed above): -PNA (no fevers, cough but CXR to evaluate) -PNX (reassured with equal b/l breath sounds, CXR to evaluate) -Symptomatic anemia (will get H&H) -Aortic Dissection as no tearing pain and no radiation to  the mid back, pulses equal -Pericarditis no rub on exam, EKG changes or hx to suggest dx -Tamponade (no notable SOB, tachycardic, hypotensive) -Esophageal rupture (no h/o diffuse vomitting/no crepitus)   Labs are reassuring, chest x-ray negative.  D-dimer negative.  Patient handed off pending repeat troponin.  Suspect discharge home.      ____________________________________________   FINAL CLINICAL IMPRESSION(S) / ED DIAGNOSES   Final diagnoses:  Chest pain, unspecified type     MEDICATIONS GIVEN DURING THIS VISIT:  Medications  ketorolac (TORADOL) 30 MG/ML injection 15 mg (15 mg Intravenous Given 07/14/19 2241)  acetaminophen (TYLENOL) tablet 650 mg (650 mg Oral Given 07/14/19 2243)     ED Discharge Orders    None       Note:  This document was prepared using Dragon  voice recognition software and may include unintentional dictation errors.   Concha Se, MD 07/14/19 2329

## 2019-07-14 NOTE — Telephone Encounter (Signed)
Patient called stating that she is having constant chest pain.  She rates the pain at 4 except when coughing it is 7 It is left side above her breast. It radiates into her arm.  She was COVID-19 positive January 29th and has completed her isolation. Per protocol patient will go to ER for evaluation. Care advice read to patient.  She verbalized understanding.  Reason for Disposition . Taking a deep breath makes pain worse  Answer Assessment - Initial Assessment Questions 1. LOCATION: "Where does it hurt?"       Above breast left side 2. RADIATION: "Does the pain go anywhere else?" (e.g., into neck, jaw, arms, back)  arm 3. ONSET: "When did the chest pain begin?" (Minutes, hours or days)      This AM 4. PATTERN "Does the pain come and go, or has it been constant since it started?"  "Does it get worse with exertion?"      constant 5. DURATION: "How long does it last" (e.g., seconds, minutes, hours)     constant 6. SEVERITY: "How bad is the pain?"  (e.g., Scale 1-10; mild, moderate, or severe)    - MILD (1-3): doesn't interfere with normal activities     - MODERATE (4-7): interferes with normal activities or awakens from sleep    - SEVERE (8-10): excruciating pain, unable to do any normal activities       4 when coughing 6 7. CARDIAC RISK FACTORS: "Do you have any history of heart problems or risk factors for heart disease?" (e.g., angina, prior heart attack; diabetes, high blood pressure, high cholesterol, smoker, or strong family history of heart disease)    family 8. PULMONARY RISK FACTORS: "Do you have any history of lung disease?"  (e.g., blood clots in lung, asthma, emphysema, birth control pills)   Positive COVID-19 Jan 29 9. CAUSE: "What do you think is causing the chest pain?"   Cough 10. OTHER SYMPTOMS: "Do you have any other symptoms?" (e.g., dizziness, nausea, vomiting, sweating, fever, difficulty breathing, cough)      tired 11. PREGNANCY: "Is there any chance you are pregnant?"  "When was your last menstrual period?"     No  Menses next due 3-4 days  Protocols used: CHEST PAIN-A-AH

## 2019-07-14 NOTE — ED Triage Notes (Signed)
Pt ambulatory to triage.  Pt has chest pain since 5am today.  Pt took tylenol without relief. [facility]   Pt has sob.  No acute distress in triage.   Pt alert  Speech clear.

## 2019-12-02 ENCOUNTER — Telehealth: Payer: Self-pay | Admitting: General Practice

## 2019-12-02 NOTE — Telephone Encounter (Signed)
Individual has been contacted 3+ times regarding ED referral and has been given information regarding the clinic and how to become a pt. No further attempts to contact individual will be made.

## 2020-08-05 DIAGNOSIS — N390 Urinary tract infection, site not specified: Secondary | ICD-10-CM | POA: Insufficient documentation

## 2020-08-22 DIAGNOSIS — R55 Syncope and collapse: Secondary | ICD-10-CM | POA: Insufficient documentation

## 2020-09-02 DIAGNOSIS — R300 Dysuria: Secondary | ICD-10-CM | POA: Insufficient documentation

## 2020-11-18 ENCOUNTER — Other Ambulatory Visit: Payer: Self-pay

## 2020-11-19 ENCOUNTER — Other Ambulatory Visit: Payer: Self-pay

## 2020-11-19 DIAGNOSIS — Z0283 Encounter for blood-alcohol and blood-drug test: Secondary | ICD-10-CM

## 2020-11-19 NOTE — Progress Notes (Signed)
Presents to [facility] [employer] for on-site pre-employment drug screen.  [facility] Acct #:  [id number] [facility] Specimen #:  [id number]  Rapid drug screen results = Negative  AMD

## 2021-01-12 ENCOUNTER — Other Ambulatory Visit: Payer: Self-pay

## 2021-01-12 ENCOUNTER — Ambulatory Visit (INDEPENDENT_AMBULATORY_CARE_PROVIDER_SITE_OTHER): Payer: 59 | Admitting: Certified Nurse Midwife

## 2021-01-12 ENCOUNTER — Other Ambulatory Visit (HOSPITAL_COMMUNITY)
Admission: RE | Admit: 2021-01-12 | Discharge: 2021-01-12 | Disposition: A | Discharge status: Home / Self Care | Payer: Self-pay | Source: Ambulatory Visit | Attending: Certified Nurse Midwife | Admitting: Certified Nurse Midwife

## 2021-01-12 ENCOUNTER — Encounter: Payer: Self-pay | Admitting: Certified Nurse Midwife

## 2021-01-12 VITALS — BP 103/62 | HR 65 | Ht 61.0 in | Wt 170.6 lb

## 2021-01-12 DIAGNOSIS — Z124 Encounter for screening for malignant neoplasm of cervix: Secondary | ICD-10-CM | POA: Diagnosis not present

## 2021-01-12 DIAGNOSIS — Z01419 Encounter for gynecological examination (general) (routine) without abnormal findings: Secondary | ICD-10-CM

## 2021-01-12 DIAGNOSIS — E669 Obesity, unspecified: Secondary | ICD-10-CM | POA: Diagnosis not present

## 2021-01-12 NOTE — Patient Instructions (Signed)
Preventive Care 21-30 Years Old, Female Preventive care refers to lifestyle choices and visits with your health care provider that can promote health and wellness. This includes: A yearly physical exam. This is also called an annual wellness visit. Regular dental and eye exams. Immunizations. Screening for certain conditions. Healthy lifestyle choices, such as: Eating a healthy diet. Getting regular exercise. Not using drugs or products that contain nicotine and tobacco. Limiting alcohol use. What can I expect for my preventive care visit? Physical exam Your health care provider may check your: Height and weight. These may be used to calculate your BMI (body mass index). BMI is a measurement that tells if you are at a healthy weight. Heart rate and blood pressure. Body temperature. Skin for abnormal spots. Counseling Your health care provider may ask you questions about your: Past medical problems. Family's medical history. Alcohol, tobacco, and drug use. Emotional well-being. Home life and relationship well-being. Sexual activity. Diet, exercise, and sleep habits. Work and work environment. Access to firearms. Method of birth control. Menstrual cycle. Pregnancy history. What immunizations do I need?  Vaccines are usually given at various ages, according to a schedule. Your health care provider will recommend vaccines for you based on your age, medicalhistory, and lifestyle or other factors, such as travel or where you work. What tests do I need?  Blood tests Lipid and cholesterol levels. These may be checked every 5 years starting at age 20. Hepatitis C test. Hepatitis B test. Screening Diabetes screening. This is done by checking your blood sugar (glucose) after you have not eaten for a while (fasting). STD (sexually transmitted disease) testing, if you are at risk. BRCA-related cancer screening. This may be done if you have a family history of breast, ovarian, tubal, or  peritoneal cancers. Pelvic exam and Pap test. This may be done every 3 years starting at age 21. Starting at age 30, this may be done every 5 years if you have a Pap test in combination with an HPV test. Talk with your health care provider about your test results, treatment options,and if necessary, the need for more tests. Follow these instructions at home: Eating and drinking  Eat a healthy diet that includes fresh fruits and vegetables, whole grains, lean protein, and low-fat dairy products. Take vitamin and mineral supplements as recommended by your health care provider. Do not drink alcohol if: Your health care provider tells you not to drink. You are pregnant, may be pregnant, or are planning to become pregnant. If you drink alcohol: Limit how much you have to 0-1 drink a day. Be aware of how much alcohol is in your drink. In the [place] one drink equals one 12 oz bottle of beer (355 mL), one 5 oz glass of wine (148 mL), or one 1 oz glass of hard liquor (44 mL).  Lifestyle Take daily care of your teeth and gums. Brush your teeth every morning and night with fluoride toothpaste. Floss one time each day. Stay active. Exercise for at least 30 minutes 5 or more days each week. Do not use any products that contain nicotine or tobacco, such as cigarettes, e-cigarettes, and chewing tobacco. If you need help quitting, ask your health care provider. Do not use drugs. If you are sexually active, practice safe sex. Use a condom or other form of protection to prevent STIs (sexually transmitted infections). If you do not wish to become pregnant, use a form of birth control. If you plan to become pregnant, see your health care   provider for a prepregnancy visit. Find healthy ways to cope with stress, such as: Meditation, yoga, or listening to music. Journaling. Talking to a trusted person. Spending time with friends and family. Safety Always wear your seat belt while driving or riding in a  vehicle. Do not drive: If you have been drinking alcohol. Do not ride with someone who has been drinking. When you are tired or distracted. While texting. Wear a helmet and other protective equipment during sports activities. If you have firearms in your house, make sure you follow all gun safety procedures. Seek help if you have been physically or sexually abused. What's next? Go to your health care provider once a year for an annual wellness visit. Ask your health care provider how often you should have your eyes and teeth checked. Stay up to date on all vaccines. This information is not intended to replace advice given to you by your health care provider. Make sure you discuss any questions you have with your healthcare provider. Document Revised: 01/11/2020 Document Reviewed: 01/24/2018 Elsevier Patient Education  2022 [employer].

## 2021-01-12 NOTE — Progress Notes (Signed)
GYNECOLOGY ANNUAL PREVENTATIVE CARE ENCOUNTER NOTE  History:     Cynthia Lara is a 30 y.o. G2P0 female here for a routine annual gynecologic exam.  Current complaints: weight gain .  Interested in weight loss management has done in the past with [facility].  Denies abnormal vaginal bleeding, discharge, pelvic pain, problems with intercourse or other gynecologic concerns.     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 Contraception: tubal ligation Last Pap: 08/31/2017. Results were: normal  Last mammogram: n/a    Upstream - 01/12/21 1114       Pregnancy Intention Screening   Does the patient want to become pregnant in the next year? No    Does the patient's partner want to become pregnant in the next year? No    Would the patient like to discuss contraceptive options today? No      Contraception Wrap Up   Current Method Female Sterilization    End Method Female Sterilization    Contraception Counseling Provided No            The pregnancy intention screening data noted above was reviewed. Potential methods of contraception were discussed. The patient elected to proceed with Female Sterilization. .  Obstetric History OB History  Gravida Para Term Preterm AB Living  2         2  SAB IAB Ectopic Multiple Live Births          2    # Outcome Date GA Lbr Len/2nd Weight Sex Delivery Anes PTL Lv  2 Gravida 2015    F Vag-Spont   LIV  1 Gravida 2010    M Vag-Spont   LIV    Past Medical History:  Diagnosis Date   Overweight     Past Surgical History:  Procedure Laterality Date   gall bladder removed     TUBAL LIGATION      Current Outpatient Medications on File Prior to Visit  Medication Sig Dispense Refill    cyanocobalamin (,VITAMIN B-12,) 1000 MCG/ML injection Inject 1 mL (1,000 mcg total) into the muscle every 30 (thirty) days. 10 mL 1   Melatonin 10 MG CAPS Take by mouth.     phentermine (ADIPEX-P) 37.5 MG tablet Take 1 tablet (37.5 mg total) by mouth daily before breakfast. 30 tablet 2   No current facility-administered medications on file prior to visit.    No Known Allergies  Social History:  reports that she has never smoked. She has never used smokeless tobacco. She reports that she does not currently use alcohol. She reports that she does not use drugs.  Family History  Problem Relation Age of Onset   Hypertension Mother    Hypertension Father    Ovarian cancer Maternal Aunt    Ovarian cancer Maternal Grandmother    Diabetes Paternal Grandmother    Breast cancer Neg Hx    Colon cancer Neg Hx     The following portions of the patient's history were reviewed and updated as appropriate: allergies, current medications, past family history, past medical history, past social history, past surgical history and problem list.  Review of Systems Pertinent items noted in HPI and remainder of comprehensive ROS otherwise negative.  Physical Exam:  BP 103/62   Pulse 65   Ht 5\' 1"  (  1.549 m)   Wt 170 lb 9.6 oz (77.4 kg)   LMP 01/05/2021 (Approximate)   BMI 32.23 kg/m  CONSTITUTIONAL: Well-developed, well-nourished female in no acute distress.  HENT:  Normocephalic, atraumatic, External right and left ear normal. Oropharynx is clear and moist EYES: Conjunctivae and EOM are normal. Pupils are equal, round, and reactive to light. No scleral icterus.  NECK: Normal range of motion, supple, no masses.  Normal thyroid.  SKIN: Skin is warm and dry. No rash noted. Not diaphoretic. No erythema. No pallor. MUSCULOSKELETAL: Normal range of motion. No tenderness.  No cyanosis, clubbing, or edema.  2+ distal pulses. NEUROLOGIC: Alert and oriented to person, place, and time. Normal reflexes, muscle  tone coordination.  PSYCHIATRIC: Normal mood and affect. Normal behavior. Normal judgment and thought content. CARDIOVASCULAR: Normal heart rate noted, regular rhythm RESPIRATORY: Clear to auscultation bilaterally. Effort and breath sounds normal, no problems with respiration noted. BREASTS: Symmetric in size. No masses, tenderness, skin changes, nipple drainage, or lymphadenopathy bilaterally.  ABDOMEN: Soft, no distention noted.  No tenderness, rebound or guarding.  PELVIC: Normal appearing external genitalia and urethral meatus; normal appearing vaginal mucosa and cervix.  No abnormal discharge noted.  Pap smear obtained.  Normal uterine size, no other palpable masses, no uterine or adnexal tenderness.  .   Assessment and Plan:    1. Women's annual routine gynecological examination   Pap: Will follow up results of pap smear and manage accordingly. Mammogram :n/a  Labs:HemA1, Tsh panel, CMP Refills:none Referral: none Routine preventative health maintenance measures emphasized. Please refer to After Visit Summary for other counseling recommendations.    Follow up for weight loss visit prn.   Doreene Burke, CNM [facility],  [facility]

## 2021-01-13 LAB — COMPREHENSIVE METABOLIC PANEL
ALT: 8 IU/L (ref 0–32)
AST: 16 IU/L (ref 0–40)
Albumin/Globulin Ratio: 1.6 (ref 1.2–2.2)
Albumin: 3.9 g/dL (ref 3.9–5.0)
Alkaline Phosphatase: 63 IU/L (ref 44–121)
BUN/Creatinine Ratio: 23 (ref 9–23)
BUN: 14 mg/dL (ref 6–20)
Bilirubin Total: 0.4 mg/dL (ref 0.0–1.2)
CO2: 23 mmol/L (ref 20–29)
Calcium: 9.3 mg/dL (ref 8.7–10.2)
Chloride: 101 mmol/L (ref 96–106)
Creatinine, Ser: 0.62 mg/dL (ref 0.57–1.00)
Globulin, Total: 2.4 g/dL (ref 1.5–4.5)
Glucose: 78 mg/dL (ref 65–99)
Potassium: 4.3 mmol/L (ref 3.5–5.2)
Sodium: 138 mmol/L (ref 134–144)
Total Protein: 6.3 g/dL (ref 6.0–8.5)
eGFR: 123 mL/min/{1.73_m2} (ref >59)

## 2021-01-13 LAB — HEMOGLOBIN A1C
Est. average glucose Bld gHb Est-mCnc: 97 mg/dL
Hgb A1c MFr Bld: 5 % (ref 4.8–5.6)

## 2021-01-13 LAB — THYROID PANEL WITH TSH
Free Thyroxine Index: 2 (ref 1.2–4.9)
T3 Uptake Ratio: 28 % (ref 24–39)
T4, Total: 7.2 ug/dL (ref 4.5–12.0)
TSH: 1.46 u[IU]/mL (ref 0.450–4.500)

## 2021-01-14 LAB — CYTOLOGY - PAP
Comment: NEGATIVE
Diagnosis: NEGATIVE
High risk HPV: NEGATIVE

## 2021-02-15 ENCOUNTER — Other Ambulatory Visit: Payer: Self-pay

## 2021-02-15 ENCOUNTER — Encounter: Payer: Self-pay | Admitting: Certified Nurse Midwife

## 2021-02-15 ENCOUNTER — Ambulatory Visit (INDEPENDENT_AMBULATORY_CARE_PROVIDER_SITE_OTHER): Payer: 59 | Admitting: Certified Nurse Midwife

## 2021-02-15 VITALS — BP 112/75 | HR 66 | Ht 61.0 in | Wt 171.9 lb

## 2021-02-15 DIAGNOSIS — Z7689 Persons encountering health services in other specified circumstances: Secondary | ICD-10-CM

## 2021-02-15 MED ORDER — PHENTERMINE HCL 37.5 MG PO TABS: 37.5000 mg | ORAL_TABLET | Freq: Every day | ORAL | 0 refills | Status: DC

## 2021-02-15 MED ORDER — CYANOCOBALAMIN 1000 MCG/ML IJ SOLN
1000.0000 ug | Freq: Once | INTRAMUSCULAR | Status: AC
  Administered 2021-02-15: 1000 ug via INTRAMUSCULAR

## 2021-02-15 NOTE — Progress Notes (Signed)
Subjective:  Criss Kimm is a 30 y.o. G2P0 at Unknown being seen today for weight loss management- initial visit.  Patient reports General ROS: negative and reports previous weight loss attempts: Management changes made at the last visit include:  adding medication, she has used phentermine in the past.  Onset was gradual  Onset followed:  change in diet and exercise.  Associated symptoms include: fatigue, depression, anxiety, change in clothing fit. Previous/Current treatment includes: small frequent feedings, nutritional  vitamin B-12 injections and appetite stimulant.  Pertinent medical history includes: none  Risk factors include: none  The patient has a surgical history of: BTL Pertinent social history includes:none  Past evaluation has included: metabolic profile, hemoglobin A1c, thyroid panel.  Past treatment has included: small frequent feedings,  vitamin B-12 injections, appetite stimulant, exercise management.  The following portions of the patient's history were reviewed and updated as appropriate: allergies, current medications, past family history, past medical history, past social history, past surgical history and problem list.   Objective:   Vitals:   02/15/21 1551  BP: 112/75  Pulse: 66  Weight: 171 lb 14.4 oz (78 kg)  Height: 5\' 1"  (1.549 m)    General:  Alert, oriented and cooperative. Patient is in no acute distress.  PE: Well groomed female in no current distress,   Mental Status: Normal mood and affect. Normal behavior. Normal judgment and thought content.   Current BMI: Body mass index is 32.48 kg/m.   Assessment and Plan:  Obesity  1. Encounter for weight management  PMP aware: [place], [place], 79F Refine SearchContact the [facility] Date of Birth: 1991-02-08 Recent Address: [address] [place], [place] Linked Records (1) Other Tools/Metrics Report generated on 02/15/2021. Report Date Range:  02/15/2018 - 02/15/2021 PDF ReportExport Narx Scores Narcotic 000 Sedative 000 Stimulant 000 Explanation and Guidance Overdose Risk Score 000 (Range 000-999) Explanation and Guidance State Indicators (0)  Discussed diet and exercise goal. Encourage use of app to track daily food in take, Discussed increasing her exercise. Currently runs 20 min (2 miles) every other day. Discussed increasing to 40 min 3-4 time wk.   Controlled substance agreement signed.  Information sheets given on use of adipex and potential risks/benefits of medication.  Plan: low carb, High protein diet RX for adipex 37.5 mg daily and B12 02/17/2021.ml monthly, to start now with first injection given at today's visit. Reviewed side-effects common to both medications and expected outcomes. Increase daily water intake to at least 8 bottle a day, every day.  Goal is to reduse weight by 5% 8.55 lbs by end of three months, and will re-evaluate then.  RTC in 4 weeks for visit to check weight & BP, and get next B12 injections.  Please refer to After Visit Summary for other counseling recommendations.    , CNM   Consider the Low Glycemic Index Diet and 6 smaller meals daily .  This boosts your metabolism and regulates your sugars:   Use the protein bar by Atkins because they have lots of fiber in them  Find the low carb flatbreads, tortillas and pita breads for sandwiches:  Joseph's makes a pita bread and a flat bread , available at [facility] and [employer]; Toufayah makes a low carb flatbread available at [facility] and HT that is 9 net carbs and 100 cal [place] makes a low carb whole wheat tortilla available at [employer] most grocery stores with 6 net carbs and 210 cal  Greek yogurt can still have a lot of carbs .  Dannon Light N fit has 80 cal and 8 carbs

## 2021-02-15 NOTE — Patient Instructions (Signed)

## 2021-02-21 ENCOUNTER — Emergency Department
Admission: EM | Admit: 2021-02-21 | Discharge: 2021-02-21 | Disposition: A | Discharge status: Home / Self Care | Payer: 59

## 2021-03-23 ENCOUNTER — Encounter: Payer: 59 | Admitting: Certified Nurse Midwife

## 2021-03-24 ENCOUNTER — Encounter: Payer: Self-pay | Admitting: Certified Nurse Midwife

## 2021-04-04 ENCOUNTER — Encounter: Payer: Self-pay | Admitting: Certified Nurse Midwife

## 2021-04-04 ENCOUNTER — Encounter: Payer: 59 | Admitting: Certified Nurse Midwife

## 2021-04-08 ENCOUNTER — Other Ambulatory Visit: Payer: 59

## 2021-04-08 ENCOUNTER — Other Ambulatory Visit: Payer: Self-pay

## 2021-04-08 NOTE — Progress Notes (Signed)
Pt cleared pre-employment uds and HR was notified./CL,RMA

## 2021-04-12 ENCOUNTER — Encounter: Payer: Self-pay | Admitting: Physician Assistant

## 2021-04-12 ENCOUNTER — Ambulatory Visit: Payer: Self-pay | Admitting: Physician Assistant

## 2021-04-12 ENCOUNTER — Ambulatory Visit: Payer: Self-pay

## 2021-04-12 ENCOUNTER — Other Ambulatory Visit: Payer: Self-pay

## 2021-04-12 VITALS — BP 113/64 | HR 69 | Temp 98.0°F | Resp 14 | Ht 61.0 in | Wt 177.0 lb

## 2021-04-12 DIAGNOSIS — Z0189 Encounter for other specified special examinations: Secondary | ICD-10-CM | POA: Insufficient documentation

## 2021-04-12 DIAGNOSIS — Z0289 Encounter for other administrative examinations: Secondary | ICD-10-CM

## 2021-04-12 DIAGNOSIS — Z021 Encounter for pre-employment examination: Secondary | ICD-10-CM

## 2021-04-12 DIAGNOSIS — M25571 Pain in right ankle and joints of right foot: Secondary | ICD-10-CM | POA: Insufficient documentation

## 2021-04-12 DIAGNOSIS — M25551 Pain in right hip: Secondary | ICD-10-CM | POA: Insufficient documentation

## 2021-04-12 LAB — POCT URINALYSIS DIPSTICK
Bilirubin, UA: NEGATIVE
Blood, UA: NEGATIVE
Glucose, UA: NEGATIVE
Ketones, UA: NEGATIVE
Leukocytes, UA: NEGATIVE
Nitrite, UA: NEGATIVE
Protein, UA: NEGATIVE
Spec Grav, UA: 1.025 (ref 1.010–1.025)
Urobilinogen, UA: 0.2 E.U./dL
pH, UA: 6 (ref 5.0–8.0)

## 2021-04-12 NOTE — Progress Notes (Signed)
Pt presents today for pre-employment physical./CL,RMA 

## 2021-04-12 NOTE — Progress Notes (Signed)
Pt denies any issues or concerns at this time/CL,RMA ?

## 2021-04-12 NOTE — Progress Notes (Signed)
   Subjective: Employment physical    Patient ID: Cynthia Lara, female    DOB: 06/30/1990, 30 y.o.   MRN: [id number]  HPI Patient presented for employment physical for [employer].  Patient is voiced no concerns or complaints.   Review of Systems No acute findings on review of systems.    Objective:   Physical Exam No acute distress.  Temperature 98, pulse 69, respiration 14, BP is 113/64, and patient 97% O2 sat on room air.  Patient was on 77 pounds BMI is 33.44. HEENT is unremarkable.  Neck is supple without lymphadenopathy, thyromegaly or, or bruits.  Lungs are clear to auscultation.  Heart regular rate and rhythm. Abdomen is negative HSM, normoactive bowel sounds, soft, nontender to palpation. No obvious deformity to the upper or lower extremities.  Patient has full and equal range of motion of the upper and lower extremities. No obvious cervical or lumbar spine deformity.  Patient has full and equal range of motion of the cervical and lumbar spine. Cranial nerves II through XII are grossly intact.  DTR 2+ without clonus.       Assessment & Plan: Well exam.   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

## 2021-04-13 ENCOUNTER — Other Ambulatory Visit: Payer: Self-pay

## 2021-04-13 DIAGNOSIS — Z021 Encounter for pre-employment examination: Secondary | ICD-10-CM

## 2021-04-13 LAB — CMP12+LP+TP+TSH+6AC+CBC/D/PLT
ALT: 6 IU/L (ref 0–32)
AST: 11 IU/L (ref 0–40)
Albumin/Globulin Ratio: 1.7 (ref 1.2–2.2)
Albumin: 4.3 g/dL (ref 3.9–5.0)
Alkaline Phosphatase: 63 IU/L (ref 44–121)
BUN/Creatinine Ratio: 20 (ref 9–23)
BUN: 15 mg/dL (ref 6–20)
Basophils Absolute: 0.1 10*3/uL (ref 0.0–0.2)
Basos: 1 %
Bilirubin Total: 0.2 mg/dL (ref 0.0–1.2)
Calcium: 9.1 mg/dL (ref 8.7–10.2)
Chloride: 102 mmol/L (ref 96–106)
Chol/HDL Ratio: 2.7 ratio (ref 0.0–4.4)
Cholesterol, Total: 161 mg/dL (ref 100–199)
Creatinine, Ser: 0.75 mg/dL (ref 0.57–1.00)
EOS (ABSOLUTE): 0.2 10*3/uL (ref 0.0–0.4)
Eos: 2 %
Estimated CHD Risk: <0.5 times avg. (ref 0.0–1.0)
Free Thyroxine Index: 1.7 (ref 1.2–4.9)
GGT: 12 IU/L (ref 0–60)
Globulin, Total: 2.6 g/dL (ref 1.5–4.5)
Glucose: 81 mg/dL (ref 70–99)
HDL: 60 mg/dL (ref >39)
Hematocrit: 37 % (ref 34.0–46.6)
Hemoglobin: 12.3 g/dL (ref 11.1–15.9)
Immature Grans (Abs): 0 10*3/uL (ref 0.0–0.1)
Immature Granulocytes: 0 %
Iron: 37 ug/dL (ref 27–159)
LDH: 122 IU/L (ref 119–226)
LDL Chol Calc (NIH): 84 mg/dL (ref 0–99)
Lymphocytes Absolute: 2.7 10*3/uL (ref 0.7–3.1)
Lymphs: 33 %
MCH: 28.3 pg (ref 26.6–33.0)
MCHC: 33.2 g/dL (ref 31.5–35.7)
MCV: 85 fL (ref 79–97)
Monocytes Absolute: 0.7 10*3/uL (ref 0.1–0.9)
Monocytes: 8 %
Neutrophils Absolute: 4.5 10*3/uL (ref 1.4–7.0)
Neutrophils: 56 %
Phosphorus: 2.9 mg/dL — ABNORMAL LOW (ref 3.0–4.3)
Platelets: 271 10*3/uL (ref 150–450)
Potassium: 4.1 mmol/L (ref 3.5–5.2)
RBC: 4.34 x10E6/uL (ref 3.77–5.28)
RDW: 12.3 % (ref 11.7–15.4)
Sodium: 135 mmol/L (ref 134–144)
T3 Uptake Ratio: 27 % (ref 24–39)
T4, Total: 6.3 ug/dL (ref 4.5–12.0)
TSH: 1.91 u[IU]/mL (ref 0.450–4.500)
Total Protein: 6.9 g/dL (ref 6.0–8.5)
Triglycerides: 89 mg/dL (ref 0–149)
Uric Acid: 4 mg/dL (ref 2.6–6.2)
VLDL Cholesterol Cal: 17 mg/dL (ref 5–40)
WBC: 8.2 10*3/uL (ref 3.4–10.8)
eGFR: 110 mL/min/{1.73_m2} (ref >59)

## 2021-04-18 LAB — HEPATITIS B SURFACE ANTIBODY,QUALITATIVE: Hep B Surface Ab, Qual: REACTIVE

## 2021-04-18 LAB — QUANTIFERON-TB GOLD PLUS
QuantiFERON Mitogen Value: >10 IU/mL
QuantiFERON Nil Value: 0 IU/mL
QuantiFERON TB1 Ag Value: 0 IU/mL
QuantiFERON TB2 Ag Value: 0 IU/mL
QuantiFERON-TB Gold Plus: NEGATIVE

## 2021-04-19 ENCOUNTER — Ambulatory Visit: Payer: Self-pay | Admitting: Physician Assistant

## 2021-04-19 ENCOUNTER — Other Ambulatory Visit: Payer: Self-pay

## 2021-04-19 ENCOUNTER — Encounter: Payer: Self-pay | Admitting: Physician Assistant

## 2021-04-19 VITALS — BP 112/70 | HR 72 | Temp 97.6°F | Resp 14 | Ht 61.0 in | Wt 170.0 lb

## 2021-04-19 DIAGNOSIS — K625 Hemorrhage of anus and rectum: Secondary | ICD-10-CM

## 2021-04-19 NOTE — Progress Notes (Signed)
   Subjective: Rectal bleeding    Patient ID: Cynthia Lara, female    DOB: November 15, 1990, 30 y.o.   MRN: [id number]  HPI Patient noticed rectal bleeding 2 bowel movements in the past 24 hours.  Patient denies rectal pain.  Patient denies provocative incident for complaint.   Review of Systems Negative septal complaint    Objective:   Physical Exam  No acute distress.  Temperature 97.6, pulse 72, respiration 14, BP is 112/70, patient 90% O2 sat on room air.  Patient was 170 pounds and BMI is 32.1 Chaperone exam reveals no external lesions or bleeding.  Patient was guaiac positive on digital exam.      Assessment & Plan: Rectal bleeding   Advised conservative treatment this time consistent stool softeners and Anusol suppositories.  Patient return back in 5 days for reevaluation.  Advised to go to the emergency room if condition worsen before reevaluation.

## 2021-04-19 NOTE — Progress Notes (Signed)
Pt noticed last night and this morning that she has a lot of blood mixed in with her stool. Pt states the only thing different she's noticed is having heart burn for the past week and had some stomach cramps over the weekend./CL,RMA

## 2021-06-23 DIAGNOSIS — U071 COVID-19: Secondary | ICD-10-CM | POA: Diagnosis not present

## 2021-09-26 ENCOUNTER — Ambulatory Visit: Payer: Self-pay | Admitting: Emergency Medicine

## 2021-09-26 DIAGNOSIS — H6123 Impacted cerumen, bilateral: Secondary | ICD-10-CM

## 2021-09-26 NOTE — Progress Notes (Signed)
?  Subjective:  ?  ? Patient ID: Cynthia Lara, female   DOB: 1990/11/10, 31 y.o.   MRN: [id number] ? ?HPI ?Unable to hear and feels like wax in ears ? ?Review of Systems ?No fever, no uri sx ?   ?Objective:  ? Physical Exam ?EAC's are obstructed by cerumen  ?   ?Assessment:  ?   ?Cerumen impaction  ?   ?Plan:  ?   ?Attempted to lavage in office, still lots there. ?Will use ear wax remover x 2 days and then return for lavage ?   ?

## 2021-09-26 NOTE — Progress Notes (Signed)
Pt has had ear pain since last Friday with hearing loss. Pt does have impacted ear wax in left ear. ?

## 2021-09-28 ENCOUNTER — Ambulatory Visit: Payer: Self-pay | Admitting: Physician Assistant

## 2021-09-28 DIAGNOSIS — H6123 Impacted cerumen, bilateral: Secondary | ICD-10-CM

## 2021-09-28 NOTE — Progress Notes (Signed)
? ?  Subjective:  ? ? Patient ID: Cynthia Lara, female    DOB: 03-19-1991, 31 y.o.   MRN: [id number] ? ?HPI ? 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 ?Seen in clinic  May 1 with bilateral cerumen impaction identified- ?Lavage partially successful ?Home Rx HS with Peroxide bilaterally -denies change in findings, denies pain  ?No discharge noted from ears ? ?Review of Systems  ?HENT:  Positive for hearing loss. Negative for ear pain, rhinorrhea and tinnitus.   ?All other systems reviewed and are negative. ?         Mildly reduced hearing reported by patient right greater than left- noted using telephone ?   ?Objective:  ? Physical Exam ?Constitutional:   ?   Appearance: Normal appearance.  ?HENT:  ?   Right Ear: There is impacted cerumen.  ?   Left Ear: There is impacted cerumen.  ?   Ears:  ?   Comments: Extensive cerumen partially filling canals, canal mildly irritated bilaterally ?TM non-viz, Bubbles noted from last night H202 therapy ? ?   Nose: Nose normal.  ?   Mouth/Throat:  ?   Mouth: Mucous membranes are moist.  ?Eyes:  ?   Conjunctiva/sclera: Conjunctivae normal.  ?Musculoskeletal:  ?   Cervical back: Normal range of motion and neck supple.  ?Neurological:  ?   Mental Status: She is alert.  ? ?   ?Assessment & Plan:  ? Bilateral peroxide x 5 min to soften followed by gentle curettage with fragments of cerumen noted and moderate amount removed . Endoscopic exam assist ?Areas of hard cerumen persist bilaterally. Small areas reveal minimal view of TM- patient  reports improved hearing- denies pain ?Sidewalls were noted to be mildly abraded post procedure - patient denies discomfort ? ?Cerumen impaction, bilateral  ?Ear Lavage with moderate debris removed ?Partial canal obstruction  persists ? ?Will continue night therapy Carbamide peroxide for the weekend and report back on Monday with status. Any onset of pain or accelerated decrease in hearing  stop intervention  ?Have discussed establishing with ENT if problem persists -  May benefit from twice yearly exam/cleanings- or problem may resolve if she no longer has to wear ?Earplugs ?Understands not to use Qtips until canals have been evaluated as clear-  ?Then either use every day or not at all ?Report back with questions/concerns ?

## 2021-11-07 ENCOUNTER — Other Ambulatory Visit: Payer: Self-pay

## 2021-11-07 ENCOUNTER — Other Ambulatory Visit: Payer: Self-pay | Admitting: Physician Assistant

## 2021-11-07 DIAGNOSIS — Z1152 Encounter for screening for COVID-19: Secondary | ICD-10-CM

## 2021-11-07 LAB — POC COVID19 BINAXNOW: SARS Coronavirus 2 Ag: NEGATIVE

## 2021-11-07 LAB — POCT RAPID STREP A (OFFICE): Rapid Strep A Screen: NEGATIVE

## 2021-11-07 MED ORDER — PROMETHAZINE-DM 6.25-15 MG/5ML PO SYRP: 5.0000 mL | ORAL_SOLUTION | Freq: Four times a day (QID) | ORAL | 0 refills | Status: DC | PRN

## 2021-11-07 MED ORDER — LIDOCAINE VISCOUS HCL 2 % MT SOLN: 5.0000 mL | Freq: Four times a day (QID) | OROMUCOSAL | 0 refills | Status: DC | PRN

## 2021-11-07 NOTE — Progress Notes (Signed)
Pt states past week shes had a bad cough to where it makes her vomit with sore throat and slight fever. Rapid covid and Strept is negative.

## 2021-11-07 NOTE — Addendum Note (Signed)
Addended by: Malachy Moan F on: 11/07/2021 11:20 AM   Modules accepted: Orders

## 2021-11-10 LAB — NOVEL CORONAVIRUS, NAA: SARS-CoV-2, NAA: NOT DETECTED

## 2021-11-16 ENCOUNTER — Ambulatory Visit: Payer: Self-pay | Admitting: Physician Assistant

## 2021-11-16 ENCOUNTER — Encounter: Payer: Self-pay | Admitting: Physician Assistant

## 2021-11-16 DIAGNOSIS — R052 Subacute cough: Secondary | ICD-10-CM

## 2021-11-16 MED ORDER — BENZONATATE 100 MG PO CAPS: 200.0000 mg | ORAL_CAPSULE | Freq: Three times a day (TID) | ORAL | 0 refills | Status: DC | PRN

## 2021-11-16 NOTE — Progress Notes (Signed)
Pt presents today for a return to work note. Pt has been out of work since Monday of last week. Pt is already off duty from Thursday 11-10-21 till today. Pt had extreme cough that was making her vomit. Pt states she's feeling better but the cough has not completely went away.

## 2021-11-16 NOTE — Progress Notes (Signed)
   Subjective: Cough    Patient ID: Cynthia Lara, female    DOB: 1990-09-13, 31 y.o.   MRN: [id number]  HPI Patient here today for return to work evaluation secondary to intermitting productive/nonproductive cough for 1 week.  Patient states cough has improved status post taking Bromfed-DM.  Patient stated cough is now nonproductive.  Denies fever or chills associated with complaint.  Patient tested negative for COVID-19 and strep pharyngitis.   Review of Systems Negative except for complaint    Objective:   Physical Exam HEENT is unremarkable.  Neck is supple followed lymphadenectomy or bruits.  Lungs are clear to auscultation and no cough during exam.  Heart regular rate and rhythm       Assessment & Plan: Cough  Patient given prescription for Tessalon Perles and advised to follow-up if no improvement in 3 to 5 days.

## 2022-01-16 ENCOUNTER — Ambulatory Visit (INDEPENDENT_AMBULATORY_CARE_PROVIDER_SITE_OTHER): Payer: 59 | Admitting: Certified Nurse Midwife

## 2022-01-16 VITALS — BP 110/73 | HR 76 | Ht 61.0 in | Wt 183.4 lb

## 2022-01-16 DIAGNOSIS — Z01419 Encounter for gynecological examination (general) (routine) without abnormal findings: Secondary | ICD-10-CM | POA: Diagnosis not present

## 2022-01-16 DIAGNOSIS — E669 Obesity, unspecified: Secondary | ICD-10-CM

## 2022-01-16 DIAGNOSIS — R635 Abnormal weight gain: Secondary | ICD-10-CM

## 2022-01-16 NOTE — Patient Instructions (Signed)

## 2022-01-16 NOTE — Progress Notes (Signed)
GYNECOLOGY ANNUAL PREVENTATIVE CARE ENCOUNTER NOTE  History:     Cynthia Lara is a 31 y.o. G2P0 female here for a routine annual gynecologic exam.  Current complaints: difficulty losing weight.   Denies abnormal vaginal bleeding, discharge, pelvic pain, problems with intercourse or other gynecologic concerns.    Body mass index is 34.65 kg/m.  Social Relationship: engaged ( female partner) moving to [place] Living: with her children Work: Police force Exercise: 2-3 x wk  Smoke/Alcohol/drug use: denies use   Gynecologic History Patient's last menstrual period was 12/30/2021. Contraception: tubal ligation Last Pap: 01/12/2021. Results were: normal with negative HPV Last mammogram: n/a .   Obstetric History OB History  Gravida Para Term Preterm AB Living  2         2  SAB IAB Ectopic Multiple Live Births          2    # Outcome Date GA Lbr Len/2nd Weight Sex Delivery Anes PTL Lv  2 Gravida 2015    F Vag-Spont   LIV  1 Gravida 2010    M Vag-Spont   LIV    Past Medical History:  Diagnosis Date   Overweight     Past Surgical History:  Procedure Laterality Date   gall bladder removed     TUBAL LIGATION      Current Outpatient Medications on File Prior to Visit  Medication Sig Dispense Refill   benzonatate (TESSALON PERLES) 100 MG capsule Take 2 capsules (200 mg total) by mouth 3 (three) times daily as needed for cough. 30 capsule 0   lidocaine (XYLOCAINE) 2 % solution Use as directed 5 mLs in the mouth or throat every 6 (six) hours as needed for mouth pain. Mix with 5 mL of Phenergan DM for swish and swallow. 100 mL 0   Melatonin 10 MG CAPS Take by mouth as needed.     promethazine-dextromethorphan (PROMETHAZINE-DM) 6.25-15 MG/5ML syrup Take 5 mLs by mouth 4 (four) times daily as needed for cough. Mix with 5 mL of viscous lidocaine for swish and swallow 118 mL 0   No current facility-administered medications on file prior to visit.    No Known Allergies  Social  History:  reports that she has never smoked. She has never used smokeless tobacco. She reports current alcohol use. She reports that she does not use drugs.  Family History  Problem Relation Age of Onset   Hypertension Mother    Hypertension Father    Ovarian cancer Maternal Aunt    Ovarian cancer Maternal Grandmother    Diabetes Paternal Grandmother    Breast cancer Neg Hx    Colon cancer Neg Hx     The following portions of the patient's history were reviewed and updated as appropriate: allergies, current medications, past family history, past medical history, past social history, past surgical history and problem list.  Review of Systems Pertinent items noted in HPI and remainder of comprehensive ROS otherwise negative.  Physical Exam:  BP 110/73   Pulse 76   Ht 5\' 1"  (1.549 m)   Wt 183 lb 6.4 oz (83.2 kg)   LMP 12/30/2021   BMI 34.65 kg/m  CONSTITUTIONAL: Well-developed, well-nourished female in no acute distress.  HENT:  Normocephalic, atraumatic, External right and left ear normal. Oropharynx is clear and moist EYES: Conjunctivae and EOM are normal. Pupils are equal, round, and reactive to light. No scleral icterus.  NECK: Normal range of motion, supple, no masses.  Normal thyroid.  SKIN: Skin  is warm and dry. No rash noted. Not diaphoretic. No erythema. No pallor. MUSCULOSKELETAL: Normal range of motion. No tenderness.  No cyanosis, clubbing, or edema.  2+ distal pulses. NEUROLOGIC: Alert and oriented to person, place, and time. Normal reflexes, muscle tone coordination.  PSYCHIATRIC: Normal mood and affect. Normal behavior. Normal judgment and thought content. CARDIOVASCULAR: Normal heart rate noted, regular rhythm RESPIRATORY: Clear to auscultation bilaterally. Effort and breath sounds normal, no problems with respiration noted. BREASTS: Symmetric in size. No masses, tenderness, skin changes, nipple drainage, or lymphadenopathy bilaterally.  ABDOMEN: Soft, no distention  noted.  No tenderness, rebound or guarding.  PELVIC: Normal appearing external genitalia and urethral meatus; normal appearing vaginal mucosa and cervix.  No abnormal discharge noted.  Pap smear not due.  Normal uterine size, no other palpable masses, no uterine or adnexal tenderness.  .   Assessment and Plan:    1. Well woman exam with routine gynecological exam  Pap: not due Mammogram : n/a  Labs:CMP/Tsh panel, Lipid  Refills: none Referral:none Routine preventative health maintenance measures emphasized. Please refer to After Visit Summary for other counseling recommendations.      Doreene Burke, CNM [facility],  [facility]

## 2022-01-17 LAB — COMPREHENSIVE METABOLIC PANEL
ALT: 10 IU/L (ref 0–32)
AST: 11 IU/L (ref 0–40)
Albumin/Globulin Ratio: 1.4 (ref 1.2–2.2)
Albumin: 4.1 g/dL (ref 3.9–4.9)
Alkaline Phosphatase: 69 IU/L (ref 44–121)
BUN/Creatinine Ratio: 19 (ref 9–23)
BUN: 16 mg/dL (ref 6–20)
Bilirubin Total: 0.3 mg/dL (ref 0.0–1.2)
CO2: 20 mmol/L (ref 20–29)
Calcium: 9.5 mg/dL (ref 8.7–10.2)
Chloride: 100 mmol/L (ref 96–106)
Creatinine, Ser: 0.83 mg/dL (ref 0.57–1.00)
Globulin, Total: 3 g/dL (ref 1.5–4.5)
Glucose: 85 mg/dL (ref 70–99)
Potassium: 3.9 mmol/L (ref 3.5–5.2)
Sodium: 137 mmol/L (ref 134–144)
Total Protein: 7.1 g/dL (ref 6.0–8.5)
eGFR: 97 mL/min/{1.73_m2} (ref >59)

## 2022-01-17 LAB — THYROID PANEL WITH TSH
Free Thyroxine Index: 2.6 (ref 1.2–4.9)
T3 Uptake Ratio: 37 % (ref 24–39)
T4, Total: 6.9 ug/dL (ref 4.5–12.0)
TSH: 1.3 u[IU]/mL (ref 0.450–4.500)

## 2022-01-17 LAB — LIPID PANEL
Chol/HDL Ratio: 3 ratio (ref 0.0–4.4)
Cholesterol, Total: 168 mg/dL (ref 100–199)
HDL: 56 mg/dL (ref >39)
LDL Chol Calc (NIH): 97 mg/dL (ref 0–99)
Triglycerides: 81 mg/dL (ref 0–149)
VLDL Cholesterol Cal: 15 mg/dL (ref 5–40)

## 2022-02-02 ENCOUNTER — Encounter: Payer: Self-pay | Admitting: Certified Nurse Midwife

## 2022-02-02 ENCOUNTER — Ambulatory Visit (INDEPENDENT_AMBULATORY_CARE_PROVIDER_SITE_OTHER): Payer: 59 | Admitting: Certified Nurse Midwife

## 2022-02-02 VITALS — BP 107/74 | HR 67 | Ht 61.0 in | Wt 187.3 lb

## 2022-02-02 DIAGNOSIS — Z713 Dietary counseling and surveillance: Secondary | ICD-10-CM

## 2022-02-02 MED ORDER — PHENTERMINE HCL 37.5 MG PO TABS: 37.5000 mg | ORAL_TABLET | Freq: Every day | ORAL | 0 refills | Status: DC

## 2022-02-02 MED ORDER — CYANOCOBALAMIN 1000 MCG/ML IJ SOLN
1000.0000 ug | Freq: Once | INTRAMUSCULAR | Status: AC
  Administered 2022-02-02: 1000 ug via INTRAMUSCULAR

## 2022-02-02 MED ORDER — CYANOCOBALAMIN 1000 MCG/ML IJ SOLN: 1000.0000 ug | Freq: Once | INTRAMUSCULAR | 5 refills | Status: AC

## 2022-02-02 NOTE — Patient Instructions (Signed)
Calorie Counting for Weight Loss Calories are units of energy. Your body needs a certain number of calories from food to keep going throughout the day. When you eat or drink more calories than your body needs, your body stores the extra calories mostly as fat. When you eat or drink fewer calories than your body needs, your body burns fat to get the energy it needs. Calorie counting means keeping track of how many calories you eat and drink each day. Calorie counting can be helpful if you need to lose weight. If you eat fewer calories than your body needs, you should lose weight. Ask your health care provider what a healthy weight is for you. For calorie counting to work, you will need to eat the right number of calories each day to lose a healthy amount of weight per week. A dietitian can help you figure out how many calories you need in a day and will suggest ways to reach your calorie goal. A healthy amount of weight to lose each week is usually 1-2 lb (0.5-0.9 kg). This usually means that your daily calorie intake should be reduced by 500-750 calories. Eating 1,200-1,500 calories a day can help most women lose weight. Eating 1,500-1,800 calories a day can help most men lose weight. What do I need to know about calorie counting? Work with your health care provider or dietitian to determine how many calories you should get each day. To meet your daily calorie goal, you will need to: Find out how many calories are in each food that you would like to eat. Try to do this before you eat. Decide how much of the food you plan to eat. Keep a food log. Do this by writing down what you ate and how many calories it had. To successfully lose weight, it is important to balance calorie counting with a healthy lifestyle that includes regular activity. Where do I find calorie information?  The number of calories in a food can be found on a Nutrition Facts label. If a food does not have a Nutrition Facts label, try  to look up the calories online or ask your dietitian for help. Remember that calories are listed per serving. If you choose to have more than one serving of a food, you will have to multiply the calories per serving by the number of servings you plan to eat. For example, the label on a package of bread might say that a serving size is 1 slice and that there are 90 calories in a serving. If you eat 1 slice, you will have eaten 90 calories. If you eat 2 slices, you will have eaten 180 calories. How do I keep a food log? After each time that you eat, record the following in your food log as soon as possible: What you ate. Be sure to include toppings, sauces, and other extras on the food. How much you ate. This can be measured in cups, ounces, or number of items. How many calories were in each food and drink. The total number of calories in the food you ate. Keep your food log near you, such as in a pocket-sized notebook or on an app or website on your mobile phone. Some programs will calculate calories for you and show you how many calories you have left to meet your daily goal. What are some portion-control tips? Know how many calories are in a serving. This will help you know how many servings you can have of a certain   food. Use a measuring cup to measure serving sizes. You could also try weighing out portions on a kitchen scale. With time, you will be able to estimate serving sizes for some foods. Take time to put servings of different foods on your favorite plates or in your favorite bowls and cups so you know what a serving looks like. Try not to eat straight from a food's packaging, such as from a bag or box. Eating straight from the package makes it hard to see how much you are eating and can lead to overeating. Put the amount you would like to eat in a cup or on a plate to make sure you are eating the right portion. Use smaller plates, glasses, and bowls for smaller portions and to prevent  overeating. Try not to multitask. For example, avoid watching TV or using your computer while eating. If it is time to eat, sit down at a table and enjoy your food. This will help you recognize when you are full. It will also help you be more mindful of what and how much you are eating. What are tips for following this plan? Reading food labels Check the calorie count compared with the serving size. The serving size may be smaller than what you are used to eating. Check the source of the calories. Try to choose foods that are high in protein, fiber, and vitamins, and low in saturated fat, trans fat, and sodium. Shopping Read nutrition labels while you shop. This will help you make healthy decisions about which foods to buy. Pay attention to nutrition labels for low-fat or fat-free foods. These foods sometimes have the same number of calories or more calories than the full-fat versions. They also often have added sugar, starch, or salt to make up for flavor that was removed with the fat. Make a grocery list of lower-calorie foods and stick to it. Cooking Try to cook your favorite foods in a healthier way. For example, try baking instead of frying. Use low-fat dairy products. Meal planning Use more fruits and vegetables. One-half of your plate should be fruits and vegetables. Include lean proteins, such as chicken, turkey, and fish. Lifestyle Each week, aim to do one of the following: 150 minutes of moderate exercise, such as walking. 75 minutes of vigorous exercise, such as running. General information Know how many calories are in the foods you eat most often. This will help you calculate calorie counts faster. Find a way of tracking calories that works for you. Get creative. Try different apps or programs if writing down calories does not work for you. What foods should I eat?  Eat nutritious foods. It is better to have a nutritious, high-calorie food, such as an avocado, than a food with  few nutrients, such as a bag of potato chips. Use your calories on foods and drinks that will fill you up and will not leave you hungry soon after eating. Examples of foods that fill you up are nuts and nut butters, vegetables, lean proteins, and high-fiber foods such as whole grains. High-fiber foods are foods with more than 5 g of fiber per serving. Pay attention to calories in drinks. Low-calorie drinks include water and unsweetened drinks. The items listed above may not be a complete list of foods and beverages you can eat. Contact a dietitian for more information. What foods should I limit? Limit foods or drinks that are not good sources of vitamins, minerals, or protein or that are high in unhealthy fats. These   include: Candy. Other sweets. Sodas, specialty coffee drinks, alcohol, and juice. The items listed above may not be a complete list of foods and beverages you should avoid. Contact a dietitian for more information. How do I count calories when eating out? Pay attention to portions. Often, portions are much larger when eating out. Try these tips to keep portions smaller: Consider sharing a meal instead of getting your own. If you get your own meal, eat only half of it. Before you start eating, ask for a container and put half of your meal into it. When available, consider ordering smaller portions from the menu instead of full portions. Pay attention to your food and drink choices. Knowing the way food is cooked and what is included with the meal can help you eat fewer calories. If calories are listed on the menu, choose the lower-calorie options. Choose dishes that include vegetables, fruits, whole grains, low-fat dairy products, and lean proteins. Choose items that are boiled, broiled, grilled, or steamed. Avoid items that are buttered, battered, fried, or served with cream sauce. Items labeled as crispy are usually fried, unless stated otherwise. Choose water, low-fat milk,  unsweetened iced tea, or other drinks without added sugar. If you want an alcoholic beverage, choose a lower-calorie option, such as a glass of wine or light beer. Ask for dressings, sauces, and syrups on the side. These are usually high in calories, so you should limit the amount you eat. If you want a salad, choose a garden salad and ask for grilled meats. Avoid extra toppings such as bacon, cheese, or fried items. Ask for the dressing on the side, or ask for olive oil and vinegar or lemon to use as dressing. Estimate how many servings of a food you are given. Knowing serving sizes will help you be aware of how much food you are eating at restaurants. Where to find more information Centers for Disease Control and Prevention: www.cdc.gov [place] Department of Agriculture: myplate.gov Summary Calorie counting means keeping track of how many calories you eat and drink each day. If you eat fewer calories than your body needs, you should lose weight. A healthy amount of weight to lose per week is usually 1-2 lb (0.5-0.9 kg). This usually means reducing your daily calorie intake by 500-750 calories. The number of calories in a food can be found on a Nutrition Facts label. If a food does not have a Nutrition Facts label, try to look up the calories online or ask your dietitian for help. Use smaller plates, glasses, and bowls for smaller portions and to prevent overeating. Use your calories on foods and drinks that will fill you up and not leave you hungry shortly after a meal. This information is not intended to replace advice given to you by your health care provider. Make sure you discuss any questions you have with your health care provider. Document Revised: 06/26/2019 Document Reviewed: 06/26/2019 Elsevier Patient Education  2023 [employer] Inc.  

## 2022-02-02 NOTE — Progress Notes (Signed)
Subjective:  Cynthia Lara is a 31 y.o. G2P0 at [facility] being seen today for weight loss management- initial visit.  Patient reports General ROS: negative and reports previous weight loss attempts:  Onset was gradual over the past year. Pt states [place] and was eating a lot of fast food.   months/year(s) ago.  Onset followed:  , change in living environment,  Associated symptoms include: fatigue, change in clothing fit  Previous treatment includes:, vitamin B-12 injections and appetite stimulant.  Pertinent medical history includes: none  Risk factors include: none  The patient has a surgical history of: gall bladder Pertinent social history includes: none . Past evaluation has included: comprehensive metabolic profile, thyroid panel  , lipid panel  Past treatment has included: small frequent feedings.   The following portions of the patient's history were reviewed and updated as appropriate: allergies, current medications, past family history, past medical history, past social history, past surgical history and problem list.   Objective:   Vitals:   02/02/22 1552  BP: 107/74  Pulse: 67  Weight: 187 lb 4.8 oz (85 kg)  Height: 5\' 1"  (1.549 m)    General:  Alert, oriented and cooperative. Patient is in no acute distress.  PE: Well groomed female in no current distress,   Mental Status: Normal mood and affect. Normal behavior. Normal judgment and thought content.   Current BMI: Body mass index is 35.39 kg/m.   Assessment and Plan:  Obesity  1. Encounter for weight loss counseling  - cyanocobalamin (VITAMIN B12) injection 1,000 mcg Phentermine 37.5 mg daily .   She plans to decrease portion size and eat grilled foods ( eliminate fried/fast foods) eat more vegetables and fruit.   Exercise: plans to walk every other day and take stairs at work.   PMP aware website reviewed.   [place], the [facility]  Knowledge/Help Center Date of Birth: 16-Jun-1990 Recent Address: [address] [place], [place] Linked Records (1) Other Tools/Metrics [facility] Report generated on 02/02/2022. Report Date Range: 02/03/2020 - 02/02/2022 PDF Report Export Narx Scores Narcotic 000 Sedative 000 Stimulant 040 Explanation and Guidance Overdose Risk Score 000 (Range 000-999) Explanation and Guidance State Indicators (0)   Plan: low carb, High protein diet RX for adipex 37.5 mg daily and B12 04/04/2022.ml monthly, to start now with first injection given at today's visit. Reviewed side-effects common to both medications and expected outcomes. Increase daily water intake to at least 8 bottle a day, every day.  Goal is to reduse weight by 5% by end of three months, and will re-evaluate then.  RTC in 4 weeks for Nurse visit to check weight & BP, and get next B12 injections.    Please refer to After Visit Summary for other counseling recommendations.    , CNM      Consider the Low Glycemic Index Diet and 6 smaller meals daily .  This boosts your metabolism and regulates your sugars:   Use the protein bar by [employer] because they have lots of fiber in them  Find the low carb flatbreads, tortillas and pita breads for sandwiches:  Joseph's makes a pita bread and a flat bread , available at [facility] and [employer]; Toufayah makes a low carb flatbread available at [facility] and HT that is 9 net carbs and 100 cal [place] makes a low carb whole wheat tortilla available at [employer] stores with 6 net carbs  and 210 cal  [place] yogurt can still have a lot of carbs .  Dannon Light N fit has 80 cal and 8 carbs

## 2022-02-28 DIAGNOSIS — M79671 Pain in right foot: Secondary | ICD-10-CM | POA: Diagnosis not present

## 2022-02-28 DIAGNOSIS — S93401A Sprain of unspecified ligament of right ankle, initial encounter: Secondary | ICD-10-CM | POA: Diagnosis not present

## 2022-02-28 DIAGNOSIS — S92354A Nondisplaced fracture of fifth metatarsal bone, right foot, initial encounter for closed fracture: Secondary | ICD-10-CM | POA: Diagnosis not present

## 2022-02-28 DIAGNOSIS — M7989 Other specified soft tissue disorders: Secondary | ICD-10-CM | POA: Diagnosis not present

## 2022-02-28 DIAGNOSIS — M25571 Pain in right ankle and joints of right foot: Secondary | ICD-10-CM | POA: Diagnosis not present

## 2022-03-08 DIAGNOSIS — M25571 Pain in right ankle and joints of right foot: Secondary | ICD-10-CM | POA: Diagnosis not present

## 2022-03-08 DIAGNOSIS — S92354A Nondisplaced fracture of fifth metatarsal bone, right foot, initial encounter for closed fracture: Secondary | ICD-10-CM | POA: Diagnosis not present

## 2022-04-12 ENCOUNTER — Encounter: Payer: 59 | Admitting: Physician Assistant

## 2022-04-17 ENCOUNTER — Ambulatory Visit: Payer: Self-pay

## 2022-04-17 DIAGNOSIS — Z0000 Encounter for general adult medical examination without abnormal findings: Secondary | ICD-10-CM

## 2022-04-17 LAB — POCT URINALYSIS DIPSTICK
Bilirubin, UA: NEGATIVE
Blood, UA: NEGATIVE
Glucose, UA: NEGATIVE
Ketones, UA: NEGATIVE
Leukocytes, UA: NEGATIVE
Nitrite, UA: NEGATIVE
Protein, UA: POSITIVE — AB
Spec Grav, UA: >=1.03 — AB (ref 1.010–1.025)
Urobilinogen, UA: 0.2 E.U./dL
pH, UA: 6 (ref 5.0–8.0)

## 2022-04-17 NOTE — Progress Notes (Signed)
Pt presents today to complete labs for physical.

## 2022-04-18 LAB — CMP12+LP+TP+TSH+6AC+CBC/D/PLT
ALT: 9 IU/L (ref 0–32)
AST: 11 IU/L (ref 0–40)
Albumin/Globulin Ratio: 1.6 (ref 1.2–2.2)
Albumin: 4.4 g/dL (ref 3.9–4.9)
Alkaline Phosphatase: 67 IU/L (ref 44–121)
BUN/Creatinine Ratio: 31 — ABNORMAL HIGH (ref 9–23)
BUN: 19 mg/dL (ref 6–20)
Basophils Absolute: 0.1 10*3/uL (ref 0.0–0.2)
Basos: 1 %
Bilirubin Total: 0.5 mg/dL (ref 0.0–1.2)
Calcium: 9.1 mg/dL (ref 8.7–10.2)
Chloride: 103 mmol/L (ref 96–106)
Chol/HDL Ratio: 2.8 ratio (ref 0.0–4.4)
Cholesterol, Total: 170 mg/dL (ref 100–199)
Creatinine, Ser: 0.62 mg/dL (ref 0.57–1.00)
EOS (ABSOLUTE): 0.1 10*3/uL (ref 0.0–0.4)
Eos: 2 %
Estimated CHD Risk: <0.5 times avg. (ref 0.0–1.0)
Free Thyroxine Index: 2.8 (ref 1.2–4.9)
GGT: 15 IU/L (ref 0–60)
Globulin, Total: 2.8 g/dL (ref 1.5–4.5)
Glucose: 86 mg/dL (ref 70–99)
HDL: 60 mg/dL (ref >39)
Hematocrit: 37.5 % (ref 34.0–46.6)
Hemoglobin: 12.3 g/dL (ref 11.1–15.9)
Immature Grans (Abs): 0 10*3/uL (ref 0.0–0.1)
Immature Granulocytes: 0 %
Iron: 97 ug/dL (ref 27–159)
LDH: 126 IU/L (ref 119–226)
LDL Chol Calc (NIH): 96 mg/dL (ref 0–99)
Lymphocytes Absolute: 2.8 10*3/uL (ref 0.7–3.1)
Lymphs: 38 %
MCH: 28.2 pg (ref 26.6–33.0)
MCHC: 32.8 g/dL (ref 31.5–35.7)
MCV: 86 fL (ref 79–97)
Monocytes Absolute: 0.4 10*3/uL (ref 0.1–0.9)
Monocytes: 6 %
Neutrophils Absolute: 4.1 10*3/uL (ref 1.4–7.0)
Neutrophils: 53 %
Phosphorus: 3.5 mg/dL (ref 3.0–4.3)
Platelets: 307 10*3/uL (ref 150–450)
Potassium: 3.6 mmol/L (ref 3.5–5.2)
RBC: 4.36 x10E6/uL (ref 3.77–5.28)
RDW: 12.7 % (ref 11.7–15.4)
Sodium: 137 mmol/L (ref 134–144)
T3 Uptake Ratio: 37 % (ref 24–39)
T4, Total: 7.5 ug/dL (ref 4.5–12.0)
TSH: 1.46 u[IU]/mL (ref 0.450–4.500)
Total Protein: 7.2 g/dL (ref 6.0–8.5)
Triglycerides: 71 mg/dL (ref 0–149)
Uric Acid: 4.7 mg/dL (ref 2.6–6.2)
VLDL Cholesterol Cal: 14 mg/dL (ref 5–40)
WBC: 7.5 10*3/uL (ref 3.4–10.8)
eGFR: 122 mL/min/{1.73_m2} (ref >59)

## 2022-04-19 ENCOUNTER — Encounter: Payer: Self-pay | Admitting: Physician Assistant

## 2022-04-19 ENCOUNTER — Ambulatory Visit: Payer: 59 | Admitting: Physician Assistant

## 2022-04-19 VITALS — BP 113/70 | HR 89 | Temp 97.6°F | Resp 14 | Ht 61.0 in | Wt 181.0 lb

## 2022-04-19 DIAGNOSIS — Z0000 Encounter for general adult medical examination without abnormal findings: Secondary | ICD-10-CM

## 2022-04-19 MED ORDER — NAPROXEN 500 MG PO TABS: 500.0000 mg | ORAL_TABLET | Freq: Two times a day (BID) | ORAL | Status: DC

## 2022-04-19 MED ORDER — ORPHENADRINE CITRATE ER 100 MG PO TB12: 100.0000 mg | ORAL_TABLET | Freq: Two times a day (BID) | ORAL | 0 refills | Status: DC

## 2022-04-19 NOTE — Progress Notes (Signed)
Pt presents today to complete physical. She denies any issues or concerns at this time/CL,RMA

## 2022-04-19 NOTE — Progress Notes (Signed)
[facility] ____________________________________________   None    (approximate)  I have reviewed the triage vital signs and the nursing notes.   HISTORY  Chief Complaint Annual Exam   HPI Cynthia Lara is a 31 y.o. female patient presents annual physical exam.  Patient was concerned secondary to 2 weeks of low back pain.  States no provocative incident for complaint.  Pain is worse with a.m. awakening.  Denies radicular component to pain.  Denies bladder or bowel dysfunction.  No specific provocative incident for complaint.        { Past Medical History:  Diagnosis Date   Overweight     Patient Active Problem List   Diagnosis Date Noted   Pain in joint of right ankle 04/12/2021   Morbid obesity ([place]) 04/12/2021   Right hip pain 04/12/2021   Syncope and collapse 08/22/2020    Past Surgical History:  Procedure Laterality Date   gall bladder removed     TUBAL LIGATION      Prior to Admission medications   Medication Sig Start Date End Date Taking? Authorizing Provider  Melatonin 10 MG CAPS Take by mouth as needed.   Yes [provider]  naproxen (NAPROSYN) 500 MG tablet Take 1 tablet (500 mg total) by mouth 2 (two) times daily with a meal. 04/19/22  Yes Sable Feil, PA-C  orphenadrine (NORFLEX) 100 MG tablet Take 1 tablet (100 mg total) by mouth 2 (two) times daily. 04/19/22  Yes Sable Feil, PA-C    Allergies Patient has no known allergies.  Family History  Problem Relation Age of Onset   Hypertension Mother    Hypertension Father    Ovarian cancer Maternal Aunt    Ovarian cancer Maternal Grandmother    Diabetes Paternal Grandmother    Breast cancer Neg Hx    Colon cancer Neg Hx     Social History Social History   Tobacco Use   Smoking status: Never   Smokeless tobacco: Never  Vaping Use   Vaping Use: Never used  Substance Use Topics   Alcohol use: Yes    Comment: occasionally   Drug use:  No    Review of Systems Constitutional: No fever/chills Eyes: No visual changes. ENT: No sore throat. Cardiovascular: Denies chest pain. Respiratory: Denies shortness of breath. Gastrointestinal: No abdominal pain.  No nausea, no vomiting.  No diarrhea.  No constipation. Genitourinary: Negative for dysuria. Musculoskeletal: Positive for back pain. Skin: Negative for rash. Neurological: Negative for headaches, focal weakness or numbness.   ____________________________________________   PHYSICAL EXAM:  VITAL SIGNS: BP is 113/70, pulse 89, respiration 14, temperature 97.6, patient 90% O2 sat on room air.  Patient weighs 181 pounds and BMI is 34.20. Constitutional: Alert and oriented. Well appearing and in no acute distress. Eyes: Conjunctivae are normal. PERRL. EOMI. Head: Atraumatic. Nose: No congestion/rhinnorhea. Mouth/Throat: Mucous membranes are moist.  Oropharynx non-erythematous. Neck: No stridor.  No cervical spine tenderness to palpation. Hematological/Lymphatic/Immunilogical: No cervical lymphadenopathy. Cardiovascular: Normal rate, regular rhythm. Grossly normal heart sounds.  Good peripheral circulation. Respiratory: Normal respiratory effort.  No retractions. Lungs CTAB. Gastrointestinal: Soft and nontender. No distention. No abdominal bruits. No CVA tenderness. Genitourinary: Deferred Musculoskeletal: No lower extremity tenderness nor edema.  No joint effusions. Neurologic:  Normal speech and language. No gross focal neurologic deficits are appreciated. No gait instability. Skin:  Skin is warm, dry and intact. No rash noted. Psychiatric: Mood and affect are normal. Speech and behavior are normal.  ____________________________________________   LABS __           Component Ref Range & Units 2 d ago (04/17/22) 3 mo ago (01/16/22) 3 mo ago (01/16/22) 3 mo ago (01/16/22) 1 yr ago (04/12/21) 1 yr ago (01/12/21) 1 yr ago (01/12/21)  Glucose 70 - 99 mg/dL 86 85   81  78 R   Uric Acid 2.6 - 6.2 mg/dL 4.7    4.0 CM    Comment:            Therapeutic target for gout patients: <6.0  BUN 6 - 20 mg/dL _0 Creatinine, Ser 0.57 - 1.00 mg/dL 0.62 0.83   0.75 0.62   eGFR >59 mL/min/1.73 122 97   110 123   BUN/Creatinine Ratio 9 - 23 31 High  _1 Sodium 134 - 144 mmol/L 137 137   135 138   Potassium 3.5 - 5.2 mmol/L 3.6 3.9   4.1 4.3   Chloride 96 - 106 mmol/L 103 100   102 101   Calcium 8.7 - 10.2 mg/dL 9.1 9.5   9.1 9.3   Phosphorus 3.0 - 4.3 mg/dL 3.5    2.9 Low     Total Protein 6.0 - 8.5 g/dL 7.2 7.1   6.9 6.3   Albumin 3.9 - 4.9 g/dL 4.4 4.1   4.3 R 3.9 R   Globulin, Total 1.5 - 4.5 g/dL 2.8 3.0   2.6 2.4   Albumin/Globulin Ratio 1.2 - 2.2 1.6 1.4   1.7 1.6   Bilirubin Total 0.0 - 1.2 mg/dL 0.5 0.3   0.2 0.4   Alkaline Phosphatase 44 - 121 IU/L 67 69   63 63   LDH 119 - 226 IU/L 126    122    AST 0 - 40 IU/L _2 ALT 0 - 32 IU/L _3 GGT 0 - 60 IU/L 15    12    Iron 27 - 159 ug/dL 97    37    Cholesterol, Total 100 - 199 mg/dL 170  168  161    Triglycerides 0 - 149 mg/dL 71  81  89    HDL >39 mg/dL 60  56  60    VLDL Cholesterol Cal 5 - 40 mg/dL _4 LDL Chol Calc (NIH) 0 - 99 mg/dL 96  97  84    Chol/HDL Ratio 0.0 - 4.4 ratio 2.8  3.0 CM  2.7 CM    Comment:                                   T. Chol/HDL Ratio                                             Men  Women                               1/2 Avg.Risk  3.4    3.3  Avg.Risk  5.0    4.4                                2X Avg.Risk  9.6    7.1                                3X Avg.Risk 23.4   11.0  Estimated CHD Risk 0.0 - 1.0 times avg.  < 0.5     < 0.5 CM    Comment: The CHD Risk is based on the T. Chol/HDL ratio. Other factors affect CHD Risk such as hypertension, smoking, diabetes, severe obesity, and family history of premature CHD.  TSH 0.450 - 4.500 uIU/mL 1.460   1.300 1.910  1.460  T4, Total 4.5 - 12.0  ug/dL 7.5   6.9 6.3  7.2  T3 Uptake Ratio 24 - 39 % 37   37 27  28  Free Thyroxine Index 1.2 - 4.9 2.8   2.6 1.7  2.0  WBC 3.4 - 10.8 x10E3/uL 7.5    8.2    RBC 3.77 - 5.28 x10E6/uL 4.36    4.34    Hemoglobin 11.1 - 15.9 g/dL 12.3    12.3    Hematocrit 34.0 - 46.6 % 37.5    37.0    MCV 79 - 97 fL 86    85    MCH 26.6 - 33.0 pg 28.2    28.3    MCHC 31.5 - 35.7 g/dL 32.8    33.2    RDW 11.7 - 15.4 % 12.7    12.3    Platelets 150 - 450 x10E3/uL 307    271    Neutrophils Not Estab. % 53    56    Lymphs Not Estab. % 38    33    Monocytes Not Estab. % 6    8    Eos Not Estab. % 2    2    Basos Not Estab. % 1    1    Neutrophils Absolute 1.4 - 7.0 x10E3/uL 4.1    4.5    Lymphocytes Absolute 0.7 - 3.1 x10E3/uL 2.8    2.7    Monocytes Absolute 0.1 - 0.9 x10E3/uL 0.4    0.7    EOS (ABSOLUTE) 0.0 - 0.4 x10E3/uL 0.1    0.2    Basophils Absolute 0.0 - 0.2 x10E3/uL 0.1    0.1    Immature Granulocytes Not Estab. % 0    0    Immature Grans (Abs) 0.0 - 0.1 x10E3/uL 0.0    0.0    Resulting Agency  _0  [facility]         Narrative Performed by: Maryan Puls Performed at:  [facility] [address] [place], [place]  [id number] Lab Director: Rush Farmer MD, Phone:  [phone]    Specimen Collected: 04/17/22 13:20 Last Resulted: 04/18/22 08:17      Lab Flowsheet      Order Details      View Encounter      Lab and Collection Details      Routing      Result History    View All Conversations on this Encounter      CM=Additional comments  R=Reference range differs from displayed range      Result Care Coordination   Patient Communication  Add Comments   Seen Back to Top      Other Results from 04/17/2022   Contains abnormal data POCT urinalysis dipstick Order: [id number] Status: Final result      Visible to patient: Yes (seen)      Next appt: None      Dx: Routine adult health maintenance    0 Result Notes           Component Ref Range & Units 2 d ago 1 yr ago 3 yr ago 13 yr ago  Color, UA  [place] yellow    Clarity, UA  cloudy cloudy    Glucose, UA Negative Negative Negative    Bilirubin, UA  neg negative    Ketones, UA  neg negative    Spec Grav, UA 1.010 - 1.025 >=1.030 Abnormal  1.025    Blood, UA  neg negative    pH, UA 5.0 - 8.0 6.0 6.0    Protein, UA Negative Positive Abnormal  Negative    Comment: trace -+  Urobilinogen, UA 0.2 or 1.0 E.U./dL 0.2 0.2  1.0 R  Nitrite, UA  neg negative    Leukocytes, UA Negative Negative Negative NEGATIVE R MODERATE Abnormal  R  Appearance   medium CLEAR Abnormal  R CLEAR R  Odor       Resulting Agency    [facility]         Specimen Collected: 04/17/22 13:17 Last Resulted: 04/17/22 13:17      Lab Flowsheet       Order Details       View Encounter       Lab and Collection Details       Routing       Result History     View All Conversations on this Encounter      R=Reference range differs from displayed range      Result Care Coordination   Patient Communication   Add Comments   Seen Back to Top         CMP12+LP+TP+TSH+6AC+CBC/D/Plt (Order [id number])  Contains abnormal data CMP12+LP+TP+TSH+6AC+CBC/D/Plt Order: [id number] Status: Final result     Visible to patient: Yes (seen)     Next appt: None     Dx: Routine adult health maintenance   0 Result Notes           Component Ref Range & Units 2 d ago (04/17/22) 3 mo ago (01/16/22) 3 mo ago (01/16/22) 3 mo ago (01/16/22) 1 yr ago (04/12/21) 1 yr ago (01/12/21) 1 yr ago (01/12/21)  Glucose 70 - 99 mg/dL 86 85   81 78 R   Uric Acid 2.6 - 6.2 mg/dL 4.7    4.0 CM    Comment:            Therapeutic target for gout patients: <6.0  BUN 6 - 20 mg/dL _0 Creatinine, Ser 0.57 - 1.00 mg/dL 0.62 0.83   0.75 0.62   eGFR >59 mL/min/1.73 122 97   110 123   BUN/Creatinine Ratio 9 - 23 31 High  _1 Sodium 134 - 144 mmol/L 137 137   135 138   Potassium  3.5 - 5.2 mmol/L 3.6 3.9   4.1 4.3   Chloride 96 - 106 mmol/L 103 100   102 101   Calcium 8.7 - 10.2 mg/dL 9.1 9.5   9.1 9.3   Phosphorus 3.0 - 4.3 mg/dL 3.5  2.9 Low     Total Protein 6.0 - 8.5 g/dL 7.2 7.1   6.9 6.3   Albumin 3.9 - 4.9 g/dL 4.4 4.1   4.3 R 3.9 R   Globulin, Total 1.5 - 4.5 g/dL 2.8 3.0   2.6 2.4   Albumin/Globulin Ratio 1.2 - 2.2 1.6 1.4   1.7 1.6   Bilirubin Total 0.0 - 1.2 mg/dL 0.5 0.3   0.2 0.4   Alkaline Phosphatase 44 - 121 IU/L 67 69   63 63   LDH 119 - 226 IU/L 126    122    AST 0 - 40 IU/L _0 ALT 0 - 32 IU/L _1 GGT 0 - 60 IU/L 15    12    Iron 27 - 159 ug/dL 97    37    Cholesterol, Total 100 - 199 mg/dL 170  168  161    Triglycerides 0 - 149 mg/dL 71  81  89    HDL >39 mg/dL 60  56  60    VLDL Cholesterol Cal 5 - 40 mg/dL _2 LDL Chol Calc (NIH) 0 - 99 mg/dL 96  97  84    Chol/HDL Ratio 0.0 - 4.4 ratio 2.8  3.0 CM  2.7 CM    Comment:                                   T. Chol/HDL Ratio                                             Men  Women                               1/2 Avg.Risk  3.4    3.3                                   Avg.Risk  5.0    4.4                                2X Avg.Risk  9.6    7.1                                3X Avg.Risk 23.4   11.0  Estimated CHD Risk 0.0 - 1.0 times avg.  < 0.5     < 0.5 CM    Comment: The CHD Risk is based on the T. Chol/HDL ratio. Other factors affect CHD Risk such as hypertension, smoking, diabetes, severe obesity, and family history of premature CHD.  TSH 0.450 - 4.500 uIU/mL 1.460   1.300 1.910  1.460  T4, Total 4.5 - 12.0 ug/dL 7.5   6.9 6.3  7.2  T3 Uptake Ratio 24 - 39 % 37   37 27  28  Free Thyroxine Index 1.2 - 4.9 2.8   2.6 1.7  2.0  WBC 3.4 - 10.8 x10E3/uL 7.5    8.2  RBC 3.77 - 5.28 x10E6/uL 4.36    4.34    Hemoglobin 11.1 - 15.9 g/dL 12.3    12.3    Hematocrit 34.0 - 46.6 % 37.5    37.0    MCV 79 - 97 fL 86    85    MCH 26.6 - 33.0 pg 28.2    28.3    MCHC  31.5 - 35.7 g/dL 32.8    33.2    RDW 11.7 - 15.4 % 12.7    12.3    Platelets 150 - 450 x10E3/uL 307    271    Neutrophils Not Estab. % 53    56    Lymphs Not Estab. % 38    33    Monocytes Not Estab. % 6    8    Eos Not Estab. % 2    2    Basos Not Estab. % 1    1    Neutrophils Absolute 1.4 - 7.0 x10E3/uL 4.1    4.5    Lymphocytes Absolute 0.7 - 3.1 x10E3/uL 2.8    2.7    Monocytes Absolute 0.1 - 0.9 x10E3/uL 0.4    0.7    EOS (ABSOLUTE) 0.0 - 0.4 x10E3/uL 0.1    0.2    Basophils Absolute 0.0 - 0.2 x10E3/uL 0.1    0.1    Immature Granulocytes Not Estab. % 0    0    Immature Grans (Abs) 0.0 - 0.1 x10E3/uL 0.0    0.0    Resulting Agency  _0  [facility]         Narrative Performed by: Maryan Puls Performed at:  [facility] [address], [place], [place]  [id number] Lab Director: Rush Farmer MD, Phone:  [phone]    Specimen Collected: 04/17/22 13:20 Last Resulted: 04/18/22 08:17      Lab Flowsheet      Order Details      View Encounter      Lab and Collection Details      Routing      Result History    View All Conversations on this Encounter      CM=Additional comments  R=Reference range differs from displayed range      Result Care Coordination   Patient Communication   Add Comments   Seen Back to Top      Other Results from 04/17/2022   Contains abnormal data POCT urinalysis dipstick Order: [id number] Status: Final result      Visible to patient: Yes (seen)      Next appt: None      Dx: Routine adult health maintenance    0 Result Notes           Component Ref Range & Units 2 d ago 1 yr ago 3 yr ago 13 yr ago  Color, UA  [place] yellow    Clarity, UA  cloudy cloudy    Glucose, UA Negative Negative Negative    Bilirubin, UA  neg negative    Ketones, UA  neg negative    Spec Grav, UA 1.010 - 1.025 >=1.030 Abnormal  1.025    Blood, UA  neg negative    pH, UA 5.0 - 8.0 6.0 6.0    Protein,  UA Negative Positive Abnormal  Negative    Comment: trace -+  Urobilinogen, UA 0.2 or 1.0 E.U./dL 0.2 0.2  1.0 R  Nitrite, UA  neg negative    Leukocytes, UA Negative Negative  Negative NEGATIVE R MODERATE Abnormal  R  Appearance   medium CLEAR Abnormal  R CLEAR R              __________________________________________  EKG ____________________________________________   INITIAL IMPRESSION / ASSESSMENT AND PLAN       Discussed no acute findings on lab results.  Patient given prescription for Norflex for back pain.  Patient advised follow-up 1 week if condition does not improve or worsens.        ____________________________________________   FINAL CLINICAL IMPRESSION  Well exam ED Discharge Orders          Ordered    orphenadrine (NORFLEX) 100 MG tablet  2 times daily        04/19/22 1318    naproxen (NAPROSYN) 500 MG tablet  2 times daily with meals        04/19/22 1318             Note:  This document was prepared using Dragon voice recognition software and may include unintentional dictation errors.

## 2022-05-03 ENCOUNTER — Other Ambulatory Visit: Payer: Self-pay

## 2022-05-03 ENCOUNTER — Other Ambulatory Visit: Payer: Self-pay | Admitting: Physician Assistant

## 2022-05-03 DIAGNOSIS — Z1152 Encounter for screening for COVID-19: Secondary | ICD-10-CM

## 2022-05-03 LAB — POCT INFLUENZA A/B
Influenza A, POC: POSITIVE — AB
Influenza B, POC: NEGATIVE

## 2022-05-03 MED ORDER — PROMETHAZINE-DM 6.25-15 MG/5ML PO SYRP: 5.0000 mL | ORAL_SOLUTION | Freq: Four times a day (QID) | ORAL | 0 refills | Status: DC | PRN

## 2022-05-03 MED ORDER — OSELTAMIVIR PHOSPHATE 75 MG PO CAPS: 75.0000 mg | ORAL_CAPSULE | Freq: Two times a day (BID) | ORAL | 0 refills | Status: DC

## 2022-05-03 MED ORDER — LIDOCAINE VISCOUS HCL 2 % MT SOLN: 5.0000 mL | Freq: Four times a day (QID) | OROMUCOSAL | 0 refills | Status: DC | PRN

## 2022-05-03 MED ORDER — IBUPROFEN 800 MG PO TABS: 800.0000 mg | ORAL_TABLET | Freq: Three times a day (TID) | ORAL | 0 refills | Status: DC | PRN

## 2022-05-03 NOTE — Progress Notes (Unsigned)
   Subjective:    Patient ID: Cynthia Lara, female    DOB: 20-Jul-1990, 31 y.o.   MRN: [id number]  HPI    Review of Systems     Objective:   Physical Exam        Assessment & Plan:

## 2022-05-03 NOTE — Progress Notes (Signed)
Pt presents today S/Sx x2 days - H/A, Sore throat, Chills, Bodyaches, Nausea   Positive for Flu type A

## 2022-09-18 ENCOUNTER — Ambulatory Visit (INDEPENDENT_AMBULATORY_CARE_PROVIDER_SITE_OTHER): Payer: Medicaid Other | Admitting: Certified Nurse Midwife

## 2022-09-18 ENCOUNTER — Encounter: Payer: Self-pay | Admitting: Certified Nurse Midwife

## 2022-09-18 VITALS — BP 108/72 | HR 67 | Resp 15 | Ht 61.0 in | Wt 180.4 lb

## 2022-09-18 DIAGNOSIS — Z713 Dietary counseling and surveillance: Secondary | ICD-10-CM | POA: Diagnosis not present

## 2022-09-18 DIAGNOSIS — E669 Obesity, unspecified: Secondary | ICD-10-CM

## 2022-09-18 DIAGNOSIS — Z6834 Body mass index (BMI) 34.0-34.9, adult: Secondary | ICD-10-CM | POA: Diagnosis not present

## 2022-09-18 MED ORDER — CYANOCOBALAMIN 1000 MCG/ML IJ SOLN
1000.0000 ug | Freq: Once | INTRAMUSCULAR | Status: AC
  Administered 2022-09-18: 1000 ug via INTRAMUSCULAR

## 2022-09-18 MED ORDER — CYANOCOBALAMIN 1000 MCG/ML IJ SOLN: 1000.0000 ug | Freq: Once | INTRAMUSCULAR | 5 refills | Status: AC

## 2022-09-18 MED ORDER — PHENTERMINE HCL 37.5 MG PO TABS: 37.5000 mg | ORAL_TABLET | Freq: Every day | ORAL | 0 refills | Status: DC

## 2022-09-18 NOTE — Progress Notes (Signed)
Subjective:  Cynthia Lara is a 32 y.o. G2P0 at [facility] being seen today for weight loss management- initial visit.  Patient reports General ROS: negative and reports previous weight loss attempts: was seen last September and was not able to move forward with weight loss plan due to work /home issues. She states things have improved and that she would like to start over. She has a new job and less stress more time to devote to herself.   Onset was gradual ove the last 1-2 yrs.  Onset followed:  change in living environment,  Change in work stress.  Associated symptoms include: fatigue, depression, anxiety,    change in clothing fit and menstrual changes. Current treatment includes: small frequent feedings,  fasting  eating 2 meals a day. Exercise 3 x wk.   Pertinent medical history includes: none.  Risk factors include: none  The patient has a surgical history of: cholecystectomy  Pertinent social history includes: none . Past evaluation has included: metabolic profile, hemoglobin A1c, thyroid panel.  Past treatment has included: small frequent feedings, nutritional supplement, vitamin B-12 injections, appetite stimulant, exercise management.  The following portions of the patient's history were reviewed and updated as appropriate: allergies, current medications, past family history, past medical history, past social history, past surgical history and problem list.   Objective:   Vitals:   09/18/22 1527  BP: 108/72  Pulse: 67  Resp: 15  Weight: 180 lb 6.4 oz (81.8 kg)  Height:  (1.549 m)  Waist: 36 in  General:  Alert, oriented and cooperative. Patient is in no acute distress.  PE: Well groomed female in no current distress,   Mental Status: Normal mood and affect. Normal behavior. Normal judgment and thought content.   Current BMI: Body mass index is 34.09 kg/m.   Assessment and Plan:  Obesity  She is currently working out 3 x wk for 30-45 min. She is fasting eating 2 x  day.  Plan: low carb, High protein diet RX for adipex 37.5 mg daily and B12 .ml monthly, to start now with first injection given at today's visit. Reviewed side-effects common to both medications and expected outcomes. Increase daily water intake to at least 8 bottle a day, every day.  Goal is to reduse weight by 10% by end of three months, and will re-evaluate then.  RTC in 4 weeks for Nurse visit to check weight & BP, and get next B12 injections.    Please refer to After Visit Summary for other counseling recommendations.    Doreene Burke, CNM    Consider the Low Glycemic Index Diet and 6 smaller meals daily .  This boosts your metabolism and regulates your sugars:   Use the protein bar by [employer]kins because they have lots of fiber in them  Find the low carb flatbreads, tortillas and pita breads for sandwiches:  Joseph's makes a pita bread and a flat bread , available at [facility] and [employer]; Toufayah makes a low carb flatbread available at [employer] and HT that is 9 net carbs and 100 cal [place] makes a low carb whole wheat tortilla available at [employer] most grocery stores with 6 net carbs and 210 cal  [place] yogurt can still have a lot of carbs .  Dannon Light N fit has 80 cal and 8 carbs

## 2022-09-18 NOTE — Patient Instructions (Signed)
Calorie Counting for Weight Loss Calories are units of energy. Your body needs a certain number of calories from food to keep going throughout the day. When you eat or drink more calories than your body needs, your body stores the extra calories mostly as fat. When you eat or drink fewer calories than your body needs, your body burns fat to get the energy it needs. Calorie counting means keeping track of how many calories you eat and drink each day. Calorie counting can be helpful if you need to lose weight. If you eat fewer calories than your body needs, you should lose weight. Ask your health care provider what a healthy weight is for you. For calorie counting to work, you will need to eat the right number of calories each day to lose a healthy amount of weight per week. A dietitian can help you figure out how many calories you need in a day and will suggest ways to reach your calorie goal. A healthy amount of weight to lose each week is usually 1-2 lb (0.5-0.9 kg). This usually means that your daily calorie intake should be reduced by 500-750 calories. Eating 1,200-1,500 calories a day can help most women lose weight. Eating 1,500-1,800 calories a day can help most men lose weight. What do I need to know about calorie counting? Work with your health care provider or dietitian to determine how many calories you should get each day. To meet your daily calorie goal, you will need to: Find out how many calories are in each food that you would like to eat. Try to do this before you eat. Decide how much of the food you plan to eat. Keep a food log. Do this by writing down what you ate and how many calories it had. To successfully lose weight, it is important to balance calorie counting with a healthy lifestyle that includes regular activity. Where do I find calorie information?  The number of calories in a food can be found on a Nutrition Facts label. If a food does not have a Nutrition Facts label, try  to look up the calories online or ask your dietitian for help. Remember that calories are listed per serving. If you choose to have more than one serving of a food, you will have to multiply the calories per serving by the number of servings you plan to eat. For example, the label on a package of bread might say that a serving size is 1 slice and that there are 90 calories in a serving. If you eat 1 slice, you will have eaten 90 calories. If you eat 2 slices, you will have eaten 180 calories. How do I keep a food log? After each time that you eat, record the following in your food log as soon as possible: What you ate. Be sure to include toppings, sauces, and other extras on the food. How much you ate. This can be measured in cups, ounces, or number of items. How many calories were in each food and drink. The total number of calories in the food you ate. Keep your food log near you, such as in a pocket-sized notebook or on an app or website on your mobile phone. Some programs will calculate calories for you and show you how many calories you have left to meet your daily goal. What are some portion-control tips? Know how many calories are in a serving. This will help you know how many servings you can have of a certain   food. Use a measuring cup to measure serving sizes. You could also try weighing out portions on a kitchen scale. With time, you will be able to estimate serving sizes for some foods. Take time to put servings of different foods on your favorite plates or in your favorite bowls and cups so you know what a serving looks like. Try not to eat straight from a food's packaging, such as from a bag or box. Eating straight from the package makes it hard to see how much you are eating and can lead to overeating. Put the amount you would like to eat in a cup or on a plate to make sure you are eating the right portion. Use smaller plates, glasses, and bowls for smaller portions and to prevent  overeating. Try not to multitask. For example, avoid watching TV or using your computer while eating. If it is time to eat, sit down at a table and enjoy your food. This will help you recognize when you are full. It will also help you be more mindful of what and how much you are eating. What are tips for following this plan? Reading food labels Check the calorie count compared with the serving size. The serving size may be smaller than what you are used to eating. Check the source of the calories. Try to choose foods that are high in protein, fiber, and vitamins, and low in saturated fat, trans fat, and sodium. Shopping Read nutrition labels while you shop. This will help you make healthy decisions about which foods to buy. Pay attention to nutrition labels for low-fat or fat-free foods. These foods sometimes have the same number of calories or more calories than the full-fat versions. They also often have added sugar, starch, or salt to make up for flavor that was removed with the fat. Make a grocery list of lower-calorie foods and stick to it. Cooking Try to cook your favorite foods in a healthier way. For example, try baking instead of frying. Use low-fat dairy products. Meal planning Use more fruits and vegetables. One-half of your plate should be fruits and vegetables. Include lean proteins, such as chicken, turkey, and fish. Lifestyle Each week, aim to do one of the following: 150 minutes of moderate exercise, such as walking. 75 minutes of vigorous exercise, such as running. General information Know how many calories are in the foods you eat most often. This will help you calculate calorie counts faster. Find a way of tracking calories that works for you. Get creative. Try different apps or programs if writing down calories does not work for you. What foods should I eat?  Eat nutritious foods. It is better to have a nutritious, high-calorie food, such as an avocado, than a food with  few nutrients, such as a bag of potato chips. Use your calories on foods and drinks that will fill you up and will not leave you hungry soon after eating. Examples of foods that fill you up are nuts and nut butters, vegetables, lean proteins, and high-fiber foods such as whole grains. High-fiber foods are foods with more than 5 g of fiber per serving. Pay attention to calories in drinks. Low-calorie drinks include water and unsweetened drinks. The items listed above may not be a complete list of foods and beverages you can eat. Contact a dietitian for more information. What foods should I limit? Limit foods or drinks that are not good sources of vitamins, minerals, or protein or that are high in unhealthy fats. These   include: Candy. Other sweets. Sodas, specialty coffee drinks, alcohol, and juice. The items listed above may not be a complete list of foods and beverages you should avoid. Contact a dietitian for more information. How do I count calories when eating out? Pay attention to portions. Often, portions are much larger when eating out. Try these tips to keep portions smaller: Consider sharing a meal instead of getting your own. If you get your own meal, eat only half of it. Before you start eating, ask for a container and put half of your meal into it. When available, consider ordering smaller portions from the menu instead of full portions. Pay attention to your food and drink choices. Knowing the way food is cooked and what is included with the meal can help you eat fewer calories. If calories are listed on the menu, choose the lower-calorie options. Choose dishes that include vegetables, fruits, whole grains, low-fat dairy products, and lean proteins. Choose items that are boiled, broiled, grilled, or steamed. Avoid items that are buttered, battered, fried, or served with cream sauce. Items labeled as crispy are usually fried, unless stated otherwise. Choose water, low-fat milk,  unsweetened iced tea, or other drinks without added sugar. If you want an alcoholic beverage, choose a lower-calorie option, such as a glass of wine or light beer. Ask for dressings, sauces, and syrups on the side. These are usually high in calories, so you should limit the amount you eat. If you want a salad, choose a garden salad and ask for grilled meats. Avoid extra toppings such as bacon, cheese, or fried items. Ask for the dressing on the side, or ask for olive oil and vinegar or lemon to use as dressing. Estimate how many servings of a food you are given. Knowing serving sizes will help you be aware of how much food you are eating at restaurants. Where to find more information Centers for Disease Control and Prevention: www.cdc.gov [place] Department of Agriculture: myplate.gov Summary Calorie counting means keeping track of how many calories you eat and drink each day. If you eat fewer calories than your body needs, you should lose weight. A healthy amount of weight to lose per week is usually 1-2 lb (0.5-0.9 kg). This usually means reducing your daily calorie intake by 500-750 calories. The number of calories in a food can be found on a Nutrition Facts label. If a food does not have a Nutrition Facts label, try to look up the calories online or ask your dietitian for help. Use smaller plates, glasses, and bowls for smaller portions and to prevent overeating. Use your calories on foods and drinks that will fill you up and not leave you hungry shortly after a meal. This information is not intended to replace advice given to you by your health care provider. Make sure you discuss any questions you have with your health care provider. Document Revised: 06/26/2019 Document Reviewed: 06/26/2019 Elsevier Patient Education  2023 [employer] Inc.  

## 2022-10-18 ENCOUNTER — Encounter: Payer: Self-pay | Admitting: Certified Nurse Midwife

## 2022-10-18 ENCOUNTER — Ambulatory Visit (INDEPENDENT_AMBULATORY_CARE_PROVIDER_SITE_OTHER): Payer: Medicaid Other | Admitting: Certified Nurse Midwife

## 2022-10-18 VITALS — BP 109/75 | HR 65 | Wt 161.6 lb

## 2022-10-18 DIAGNOSIS — Z713 Dietary counseling and surveillance: Secondary | ICD-10-CM | POA: Diagnosis not present

## 2022-10-18 MED ORDER — CYANOCOBALAMIN 1000 MCG/ML IJ SOLN
1000.0000 ug | Freq: Once | INTRAMUSCULAR | Status: AC
  Administered 2022-10-18: 1000 ug via INTRAMUSCULAR

## 2022-10-18 MED ORDER — PHENTERMINE HCL 37.5 MG PO TABS: 37.5000 mg | ORAL_TABLET | Freq: Every day | ORAL | 0 refills | Status: DC

## 2022-10-18 NOTE — Progress Notes (Signed)
SUBJECTIVE:  32 y.o. here for follow-up weight loss visit, previously seen 4 weeks ago. Denies any concerns and feels like medication is working well. She denies any side effects with use/ She lost 19 lbs this past month . She state she has been fasting and counting calories. 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   OBJECTIVE:  BP 109/75   Pulse 65   Wt 161 lb 9.6 oz (73.3 kg)   LMP 10/13/2022   BMI 30.53 kg/m   Body mass index is 30.53 kg/m. Patient appears well. ASSESSMENT:  Obesity- responding well to weight loss plan PLAN:  To continue with current medications. B12 1036mcg/ml injection given RTC in 4 weeks as planned  Doreene Burke,  CNM

## 2022-11-15 ENCOUNTER — Encounter: Payer: Self-pay | Admitting: Certified Nurse Midwife

## 2022-11-15 ENCOUNTER — Ambulatory Visit (INDEPENDENT_AMBULATORY_CARE_PROVIDER_SITE_OTHER): Payer: Medicaid Other | Admitting: Certified Nurse Midwife

## 2022-11-15 VITALS — BP 100/66 | HR 71 | Ht 61.0 in | Wt 152.5 lb

## 2022-11-15 DIAGNOSIS — Z713 Dietary counseling and surveillance: Secondary | ICD-10-CM | POA: Diagnosis not present

## 2022-11-15 DIAGNOSIS — Z7689 Persons encountering health services in other specified circumstances: Secondary | ICD-10-CM

## 2022-11-15 MED ORDER — PHENTERMINE HCL 37.5 MG PO TABS: 37.5000 mg | ORAL_TABLET | Freq: Every day | ORAL | 0 refills | Status: DC

## 2022-11-15 NOTE — Patient Instructions (Signed)

## 2022-11-15 NOTE — Progress Notes (Signed)
SUBJECTIVE:  32 y.o. here for follow-up weight loss visit, previously seen 4 weeks ago. Denies any concerns and feels like medication is working . She denies any concerns with SOB, chest pain or discomfort. She lost 9 lbs this past month.    OBJECTIVE:  BP 100/66   Pulse 71   Ht 5\' 1"  (1.549 m)   Wt 152 lb 8 oz (69.2 kg)   LMP 10/13/2022   BMI 28.81 kg/m   Body mass index is 28.81 kg/m.  Waist: 31 in Patient appears well.  ASSESSMENT:  Obesity- responding well to weight loss plan PLAN:  To continue with current phentermine. Declines B-12 state that after getting injection she is unable to sleep. Requests to stop .   RTC in 4 weeks as planned  Doreene Burke, CNM

## 2022-12-04 ENCOUNTER — Encounter: Payer: Self-pay | Admitting: Certified Nurse Midwife

## 2022-12-04 ENCOUNTER — Telehealth: Payer: Medicaid Other | Admitting: Nurse Practitioner

## 2022-12-04 DIAGNOSIS — J029 Acute pharyngitis, unspecified: Secondary | ICD-10-CM | POA: Diagnosis not present

## 2022-12-04 NOTE — Progress Notes (Signed)
E-Visit for Sore Throat  We are sorry that you are not feeling well.  Here is how we plan to help!  Many viruses start with a sore throat. Typically with the flu you will have a high fever. COVID also has been known to start with a sore, you may want to take a home COVID test if you can.   Your symptoms indicate a likely viral infection (Pharyngitis).   Pharyngitis is inflammation in the back of the throat which can cause a sore throat, scratchiness and sometimes difficulty swallowing.   Pharyngitis is typically caused by a respiratory virus and will just run its course.  Please keep in mind that your symptoms could last up to 10 days.  For throat pain, we recommend over the counter oral pain relief medications such as acetaminophen or aspirin, or anti-inflammatory medications such as ibuprofen or naproxen sodium.  Topical treatments such as oral throat lozenges or sprays may be used as needed.  Avoid close contact with loved ones, especially the very young and elderly.  Remember to wash your hands thoroughly throughout the day as this is the number one way to prevent the spread of infection and wipe down door knobs and counters with disinfectant.  After careful review of your answers, I would not recommend an antibiotic for your condition.  Antibiotics should not be used to treat conditions that we suspect are caused by viruses like the virus that causes the common cold or flu. However, some people can have Strep with atypical symptoms. You may need formal testing in clinic or office to confirm if your symptoms continue or worsen.  Providers prescribe antibiotics to treat infections caused by bacteria. Antibiotics are very powerful in treating bacterial infections when they are used properly.  To maintain their effectiveness, they should be used only when necessary.  Overuse of antibiotics has resulted in the development of super bugs that are resistant to treatment!    Home Care: Only take  medications as instructed by your medical team. Do not drink alcohol while taking these medications. A steam or ultrasonic humidifier can help congestion.  You can place a towel over your head and breathe in the steam from hot water coming from a faucet. Avoid close contacts especially the very young and the elderly. Cover your mouth when you cough or sneeze. Always remember to wash your hands.  Get Help Right Away If: You develop worsening fever or throat pain. You develop a severe head ache or visual changes. Your symptoms persist after you have completed your treatment plan.  Make sure you Understand these instructions. Will watch your condition. Will get help right away if you are not doing well or get worse.   Thank you for choosing an e-visit.  Your e-visit answers were reviewed by a board certified advanced clinical practitioner to complete your personal care plan. Depending upon the condition, your plan could have included both over the counter or prescription medications.  Please review your pharmacy choice. Make sure the pharmacy is open so you can pick up prescription now. If there is a problem, you may contact your provider through [employer] and have the prescription routed to another pharmacy.  Your safety is important to [place]. If you have drug allergies check your prescription carefully.   For the next 24 hours you can use MyChart to ask questions about today's visit, request a non-urgent call back, or ask for a work or school excuse. You will get an email in the  next two days asking about your experience. I hope that your e-visit has been valuable and will speed your recovery.   I spent approximately 5 minutes reviewing the patient's history, current symptoms and coordinating their care today.

## 2022-12-06 ENCOUNTER — Ambulatory Visit (INDEPENDENT_AMBULATORY_CARE_PROVIDER_SITE_OTHER): Payer: Medicaid Other | Admitting: Certified Nurse Midwife

## 2022-12-06 ENCOUNTER — Encounter: Payer: Self-pay | Admitting: Certified Nurse Midwife

## 2022-12-06 VITALS — BP 101/68 | HR 9 | Wt 147.7 lb

## 2022-12-06 DIAGNOSIS — Z7689 Persons encountering health services in other specified circumstances: Secondary | ICD-10-CM

## 2022-12-06 DIAGNOSIS — Z713 Dietary counseling and surveillance: Secondary | ICD-10-CM

## 2022-12-06 DIAGNOSIS — Z6827 Body mass index (BMI) 27.0-27.9, adult: Secondary | ICD-10-CM | POA: Diagnosis not present

## 2022-12-06 DIAGNOSIS — E669 Obesity, unspecified: Secondary | ICD-10-CM | POA: Diagnosis not present

## 2022-12-06 MED ORDER — PHENTERMINE HCL 37.5 MG PO TABS: 37.5000 mg | ORAL_TABLET | Freq: Every day | ORAL | 0 refills | Status: DC

## 2022-12-06 NOTE — Progress Notes (Signed)
SUBJECTIVE:  32 y.o. here for follow-up weight loss visit, previously seen 4 weeks ago. Denies any concerns and feels like medication is working well. She lost a total of 32.7 lbs since she started. She denies any issue with taking the medication.   OBJECTIVE:  BP 101/68   Pulse (!) 9   Wt 147 lb 11.2 oz (67 kg)   BMI 27.91 kg/m   Body mass index is 27.91 kg/m.  Waist: 30.5 in   Patient appears well. ASSESSMENT:  Obesity- responding well to weight loss plan PLAN:  To continue with current medications.    RTC in 4 weeks as planned  Doreene Burke, CNM

## 2022-12-06 NOTE — Patient Instructions (Signed)

## 2022-12-11 DIAGNOSIS — J00 Acute nasopharyngitis [common cold]: Secondary | ICD-10-CM | POA: Diagnosis not present

## 2022-12-23 DIAGNOSIS — H60501 Unspecified acute noninfective otitis externa, right ear: Secondary | ICD-10-CM | POA: Diagnosis not present

## 2022-12-27 ENCOUNTER — Encounter: Payer: Self-pay | Admitting: Certified Nurse Midwife

## 2022-12-27 ENCOUNTER — Ambulatory Visit (INDEPENDENT_AMBULATORY_CARE_PROVIDER_SITE_OTHER): Payer: Medicaid Other | Admitting: Certified Nurse Midwife

## 2022-12-27 VITALS — BP 97/65 | HR 66 | Wt 152.3 lb

## 2022-12-27 DIAGNOSIS — Z713 Dietary counseling and surveillance: Secondary | ICD-10-CM

## 2022-12-27 DIAGNOSIS — Z7689 Persons encountering health services in other specified circumstances: Secondary | ICD-10-CM

## 2022-12-27 MED ORDER — PHENTERMINE HCL 37.5 MG PO TABS: 37.5000 mg | ORAL_TABLET | Freq: Every day | ORAL | 0 refills | Status: DC

## 2022-12-27 NOTE — Patient Instructions (Signed)
Calorie Counting for Weight Loss Calories are units of energy. Your body needs a certain number of calories from food to keep going throughout the day. When you eat or drink more calories than your body needs, your body stores the extra calories mostly as fat. When you eat or drink fewer calories than your body needs, your body burns fat to get the energy it needs. Calorie counting means keeping track of how many calories you eat and drink each day. Calorie counting can be helpful if you need to lose weight. If you eat fewer calories than your body needs, you should lose weight. Ask your health care provider what a healthy weight is for you. For calorie counting to work, you will need to eat the right number of calories each day to lose a healthy amount of weight per week. A dietitian can help you figure out how many calories you need in a day and will suggest ways to reach your calorie goal. A healthy amount of weight to lose each week is usually 1-2 lb (0.5-0.9 kg). This usually means that your daily calorie intake should be reduced by 500-750 calories. Eating 1,200-1,500 calories a day can help most women lose weight. Eating 1,500-1,800 calories a day can help most men lose weight. What do I need to know about calorie counting? Work with your health care provider or dietitian to determine how many calories you should get each day. To meet your daily calorie goal, you will need to: Find out how many calories are in each food that you would like to eat. Try to do this before you eat. Decide how much of the food you plan to eat. Keep a food log. Do this by writing down what you ate and how many calories it had. To successfully lose weight, it is important to balance calorie counting with a healthy lifestyle that includes regular activity. Where do I find calorie information?  The number of calories in a food can be found on a Nutrition Facts label. If a food does not have a Nutrition Facts label, try  to look up the calories online or ask your dietitian for help. Remember that calories are listed per serving. If you choose to have more than one serving of a food, you will have to multiply the calories per serving by the number of servings you plan to eat. For example, the label on a package of bread might say that a serving size is 1 slice and that there are 90 calories in a serving. If you eat 1 slice, you will have eaten 90 calories. If you eat 2 slices, you will have eaten 180 calories. How do I keep a food log? After each time that you eat, record the following in your food log as soon as possible: What you ate. Be sure to include toppings, sauces, and other extras on the food. How much you ate. This can be measured in cups, ounces, or number of items. How many calories were in each food and drink. The total number of calories in the food you ate. Keep your food log near you, such as in a pocket-sized notebook or on an app or website on your mobile phone. Some programs will calculate calories for you and show you how many calories you have left to meet your daily goal. What are some portion-control tips? Know how many calories are in a serving. This will help you know how many servings you can have of a certain   food. Use a measuring cup to measure serving sizes. You could also try weighing out portions on a kitchen scale. With time, you will be able to estimate serving sizes for some foods. Take time to put servings of different foods on your favorite plates or in your favorite bowls and cups so you know what a serving looks like. Try not to eat straight from a food's packaging, such as from a bag or box. Eating straight from the package makes it hard to see how much you are eating and can lead to overeating. Put the amount you would like to eat in a cup or on a plate to make sure you are eating the right portion. Use smaller plates, glasses, and bowls for smaller portions and to prevent  overeating. Try not to multitask. For example, avoid watching TV or using your computer while eating. If it is time to eat, sit down at a table and enjoy your food. This will help you recognize when you are full. It will also help you be more mindful of what and how much you are eating. What are tips for following this plan? Reading food labels Check the calorie count compared with the serving size. The serving size may be smaller than what you are used to eating. Check the source of the calories. Try to choose foods that are high in protein, fiber, and vitamins, and low in saturated fat, trans fat, and sodium. Shopping Read nutrition labels while you shop. This will help you make healthy decisions about which foods to buy. Pay attention to nutrition labels for low-fat or fat-free foods. These foods sometimes have the same number of calories or more calories than the full-fat versions. They also often have added sugar, starch, or salt to make up for flavor that was removed with the fat. Make a grocery list of lower-calorie foods and stick to it. Cooking Try to cook your favorite foods in a healthier way. For example, try baking instead of frying. Use low-fat dairy products. Meal planning Use more fruits and vegetables. One-half of your plate should be fruits and vegetables. Include lean proteins, such as chicken, turkey, and fish. Lifestyle Each week, aim to do one of the following: 150 minutes of moderate exercise, such as walking. 75 minutes of vigorous exercise, such as running. General information Know how many calories are in the foods you eat most often. This will help you calculate calorie counts faster. Find a way of tracking calories that works for you. Get creative. Try different apps or programs if writing down calories does not work for you. What foods should I eat?  Eat nutritious foods. It is better to have a nutritious, high-calorie food, such as an avocado, than a food with  few nutrients, such as a bag of potato chips. Use your calories on foods and drinks that will fill you up and will not leave you hungry soon after eating. Examples of foods that fill you up are nuts and nut butters, vegetables, lean proteins, and high-fiber foods such as whole grains. High-fiber foods are foods with more than 5 g of fiber per serving. Pay attention to calories in drinks. Low-calorie drinks include water and unsweetened drinks. The items listed above may not be a complete list of foods and beverages you can eat. Contact a dietitian for more information. What foods should I limit? Limit foods or drinks that are not good sources of vitamins, minerals, or protein or that are high in unhealthy fats. These   include: Candy. Other sweets. Sodas, specialty coffee drinks, alcohol, and juice. The items listed above may not be a complete list of foods and beverages you should avoid. Contact a dietitian for more information. How do I count calories when eating out? Pay attention to portions. Often, portions are much larger when eating out. Try these tips to keep portions smaller: Consider sharing a meal instead of getting your own. If you get your own meal, eat only half of it. Before you start eating, ask for a container and put half of your meal into it. When available, consider ordering smaller portions from the menu instead of full portions. Pay attention to your food and drink choices. Knowing the way food is cooked and what is included with the meal can help you eat fewer calories. If calories are listed on the menu, choose the lower-calorie options. Choose dishes that include vegetables, fruits, whole grains, low-fat dairy products, and lean proteins. Choose items that are boiled, broiled, grilled, or steamed. Avoid items that are buttered, battered, fried, or served with cream sauce. Items labeled as crispy are usually fried, unless stated otherwise. Choose water, low-fat milk,  unsweetened iced tea, or other drinks without added sugar. If you want an alcoholic beverage, choose a lower-calorie option, such as a glass of wine or light beer. Ask for dressings, sauces, and syrups on the side. These are usually high in calories, so you should limit the amount you eat. If you want a salad, choose a garden salad and ask for grilled meats. Avoid extra toppings such as bacon, cheese, or fried items. Ask for the dressing on the side, or ask for olive oil and vinegar or lemon to use as dressing. Estimate how many servings of a food you are given. Knowing serving sizes will help you be aware of how much food you are eating at restaurants. Where to find more information Centers for Disease Control and Prevention: www.cdc.gov [place] Department of Agriculture: myplate.gov Summary Calorie counting means keeping track of how many calories you eat and drink each day. If you eat fewer calories than your body needs, you should lose weight. A healthy amount of weight to lose per week is usually 1-2 lb (0.5-0.9 kg). This usually means reducing your daily calorie intake by 500-750 calories. The number of calories in a food can be found on a Nutrition Facts label. If a food does not have a Nutrition Facts label, try to look up the calories online or ask your dietitian for help. Use smaller plates, glasses, and bowls for smaller portions and to prevent overeating. Use your calories on foods and drinks that will fill you up and not leave you hungry shortly after a meal. This information is not intended to replace advice given to you by your health care provider. Make sure you discuss any questions you have with your health care provider. Document Revised: 06/26/2019 Document Reviewed: 06/26/2019 Elsevier Patient Education  2023 [employer] Inc.  

## 2022-12-27 NOTE — Progress Notes (Signed)
SUBJECTIVE:  32 y.o. here for follow-up weight loss visit, previously seen 4 weeks ago. Denies any concerns and feels like medication is working,. She gained a little bit of weight. She was on vacation last week. Reassurance given. Encouraged pt to that its ok, but now that she is back she needs to resume her regular diet and exercise.   OBJECTIVE:  BP 97/65   Pulse 66   Wt 152 lb 4.8 oz (69.1 kg)   BMI 28.78 kg/m   Body mass index is 28.78 kg/m.  Waist 32.4   Patient appears well. ASSESSMENT:  Obesity- responding well to weight loss plan  PLAN:  To continue with current medications.   RTC in 4 weeks as planned  Doreene Burke, CNM

## 2023-01-25 ENCOUNTER — Ambulatory Visit: Payer: Medicaid Other | Admitting: Certified Nurse Midwife

## 2023-04-23 ENCOUNTER — Ambulatory Visit: Payer: Medicaid Other | Admitting: Certified Nurse Midwife

## 2023-06-08 ENCOUNTER — Ambulatory Visit: Payer: 59 | Admitting: Physician Assistant

## 2023-06-27 ENCOUNTER — Encounter: Payer: Self-pay | Admitting: Physician Assistant

## 2023-06-27 ENCOUNTER — Ambulatory Visit: Payer: Medicaid Other | Admitting: Physician Assistant

## 2023-06-27 VITALS — BP 93/61 | HR 67 | Ht 61.0 in | Wt 142.0 lb

## 2023-06-27 DIAGNOSIS — R5383 Other fatigue: Secondary | ICD-10-CM | POA: Diagnosis not present

## 2023-06-27 DIAGNOSIS — K30 Functional dyspepsia: Secondary | ICD-10-CM

## 2023-06-27 DIAGNOSIS — N920 Excessive and frequent menstruation with regular cycle: Secondary | ICD-10-CM

## 2023-06-27 DIAGNOSIS — R634 Abnormal weight loss: Secondary | ICD-10-CM | POA: Diagnosis not present

## 2023-06-27 DIAGNOSIS — Z7689 Persons encountering health services in other specified circumstances: Secondary | ICD-10-CM | POA: Diagnosis not present

## 2023-06-27 DIAGNOSIS — R61 Generalized hyperhidrosis: Secondary | ICD-10-CM

## 2023-06-27 NOTE — Progress Notes (Signed)
New patient visit  Patient: Cynthia Lara   DOB: 1991-03-09   32 y.o. Female  MRN: [id number] Visit Date: 06/27/2023  Today's healthcare provider: Debera Lat, PA-C   Chief Complaint  Patient presents with   New Patient (Initial Visit)    Pt reports she has not seen a doctor in a while and have had possible medical problems ariserns to report. No concerns to report   Subjective    Cynthia Lara is a 33 y.o. female who presents today as a new patient to establish care.   Discussed the use of AI scribe software for clinical note transcription with the patient, who gave verbal consent to proceed.  History of Present Illness   The patient, with a past medical history of gallbladder removal and recent phentermine use, presents with fatigue and night sweats for the past one to two months. They deny any recent travel or exposure to individuals from different countries. They also deny any cough or respiratory issues.  The patient reports significant gastrointestinal issues, including constant nausea and difficulty eating. They often feel sick after eating and have frequent bowel movements immediately after meals. These symptoms have been present since they stopped taking phentermine in August. The patient has lost approximately forty pounds over the past three months, which they attribute to their decreased appetite and gastrointestinal issues. They also report occasional abdominal cramping, primarily in the lower abdomen.  The patient denies any blood in their urine or stool, but does report occasional dark stools. They also report heavy menstrual periods, which may contribute to their night sweats. The patient has a family history of diabetes, with their paternal grandmother having the condition. They deny any vision problems, numbness, tingling, or weakness. They also deny any recent changes in their environment, diet, or lifestyle.        Past Medical History:  Diagnosis Date    Overweight    Past Surgical History:  Procedure Laterality Date   CHOLECYSTECTOMY  2021   gall bladder removed     TUBAL LIGATION     Family Status  Relation Name Status   Mother  (Not Specified)   Father  (Not Specified)   Mat Aunt  (Not Specified)   MGM  (Not Specified)   PGM  (Not Specified)   Neg Hx  (Not Specified)  No partnership data on file   Family History  Problem Relation Age of Onset   Hypertension Mother    Hypertension Father    Ovarian cancer Maternal Aunt    Ovarian cancer Maternal Grandmother    Diabetes Paternal Grandmother    Breast cancer Neg Hx    Colon cancer Neg Hx    Social History   Socioeconomic History   Marital status: Significant Other    Spouse name: Not on file   Number of children: Not on file   Years of education: Not on file   Highest education level: Associate degree: occupational, Scientist, product/process development, or vocational program  Occupational History   Not on file  Tobacco Use   Smoking status: Never   Smokeless tobacco: Never  Vaping Use   Vaping status: Never Used  Substance and Sexual Activity   Alcohol use: Yes    Comment: occasionally   Drug use: No   Sexual activity: Yes    Birth control/protection: Surgical  Other Topics Concern   Not on file  Social History Narrative   Not on file   Social Drivers of Health   Financial Resource Strain: Medium Risk (  06/25/2023)   Overall Financial Resource Strain (CARDIA)    Difficulty of Paying Living Expenses: Somewhat hard  Food Insecurity: Food Insecurity Present (06/25/2023)   Hunger Vital Sign    Worried About Running Out of Food in the Last Year: Sometimes true    Ran Out of Food in the Last Year: Never true  Transportation Needs: No Transportation Needs (06/25/2023)   PRAPARE - Administrator, Civil Service (Medical): No    Lack of Transportation (Non-Medical): No  Physical Activity: Insufficiently Active (06/25/2023)   Exercise Vital Sign    Days of Exercise per Week: 3  days    Minutes of Exercise per Session: 30 min  Stress: Stress Concern Present (06/25/2023)   [employer] of Occupational Health - Occupational Stress Questionnaire    Feeling of Stress : Rather much  Social Connections: Unknown (06/25/2023)   Social Connection and Isolation Panel [NHANES]    Frequency of Communication with Friends and Family: More than three times a week    Frequency of Social Gatherings with Friends and Family: Twice a week    Attends Religious Services: Never    Database administrator or Organizations: No    Attends Engineer, structural: Not on file    Marital Status: Patient declined   Outpatient Medications Prior to Visit  Medication Sig   ibuprofen (ADVIL) 800 MG tablet Take 1 tablet (800 mg total) by mouth every 8 (eight) hours as needed for moderate pain.   Melatonin 10 MG CAPS Take by mouth as needed.   phentermine (ADIPEX-P) 37.5 MG tablet Take 1 tablet (37.5 mg total) by mouth daily before breakfast.   No facility-administered medications prior to visit.   No Known Allergies  Immunization History  Administered Date(s) Administered   Adenovirus 10/10/2019   Hep A / Hep B 10/10/2019, 11/14/2019   IPV 10/10/2019   Influenza Inj Mdck Quad Pf 10/10/2019   Influenza-Unspecified 10/10/2019, 03/31/2021   MMR 10/10/2019, 11/14/2019   Meningococcal Conjugate 10/10/2019   PFIZER(Purple Top)SARS-COV-2 Vaccination 01/03/2020, 02/07/2020   Tdap 10/10/2019    Health Maintenance  Topic Date Due   COVID-19 Vaccine (3 - 2024-25 season) 01/28/2023   INFLUENZA VACCINE  08/27/2023 (Originally 12/28/2022)   Cervical Cancer Screening (HPV/Pap Cotest)  01/12/2026   DTaP/Tdap/Td (2 - Td or Tdap) 10/09/2029   Hepatitis C Screening  Completed   HIV Screening  Completed   HPV VACCINES  Aged Out    Patient Care Team: Debera Lat, PA-C as PCP - General (Physician Assistant)  Review of Systems  All other systems reviewed and are negative.  Except see  HPI       Objective    BP 93/61 (BP Location: Right Arm, Patient Position: Sitting, Cuff Size: Normal) Comment: MAP 72  Pulse 67   Ht 5\' 1"  (1.549 m)   Wt 142 lb (64.4 kg)   SpO2 99%   BMI 26.83 kg/m     Physical Exam Vitals reviewed.  Constitutional:      General: She is not in acute distress.    Appearance: Normal appearance. She is well-developed. She is not diaphoretic.  HENT:     Head: Normocephalic and atraumatic.  Eyes:     General: No scleral icterus.    Conjunctiva/sclera: Conjunctivae normal.  Neck:     Thyroid: No thyromegaly.  Cardiovascular:     Rate and Rhythm: Normal rate and regular rhythm.     Pulses: Normal pulses.     Heart sounds:  Normal heart sounds. No murmur heard. Pulmonary:     Effort: Pulmonary effort is normal. No respiratory distress.     Breath sounds: Normal breath sounds. No wheezing, rhonchi or rales.  Musculoskeletal:     Cervical back: Neck supple.     Right lower leg: No edema.     Left lower leg: No edema.  Lymphadenopathy:     Cervical: No cervical adenopathy.  Skin:    General: Skin is warm and dry.     Findings: No rash.  Neurological:     Mental Status: She is alert and oriented to person, place, and time. Mental status is at baseline.  Psychiatric:        Mood and Affect: Mood normal.        Behavior: Behavior normal.     Depression Screen    06/27/2023    3:05 PM  PHQ 2/9 Scores  PHQ - 2 Score 2  PHQ- 9 Score 9   No results found for any visits on 06/27/23.  Assessment & Plan      Night Sweats Patient reports nightly sweats for the past 1-2 months. No recent travel or exposure to individuals from different countries. No cough or respiratory symptoms. -Order comprehensive blood work to assess for potential causes including anemia, liver and kidney function, electrolytes, lipid profile, thyroid function, and diabetes.  Gastrointestinal Distress Patient reports nausea, decreased appetite, and frequent bowel  movements since discontinuing phentermine in August. Reports significant weight loss of 40 pounds over 3 months. Reports occasional dark stools and abdominal cramping, primarily in the lower abdomen. -Order comprehensive blood work as above to assess for potential causes. -Consider referral to gastroenterology if symptoms persist.  Menorrhagia Patient reports heavy menstrual periods. No pain reported. -Continue current management with ibuprofen as needed. -Consider further evaluation if symptoms worsen or persist.  Follow-up in 2 weeks to review results of blood work and reassess symptoms. Encourage healthy eating habits, hydration, and avoiding eating 5-6 hours before bedtime.     Other fatigue Chronic and workup Cbc, cmp, lp, a1c, tsh Will reassess after  receiving lab results  Encounter to establish care Welcomed to our clinic Reviewed past medical hx, social hx, family hx and surgical hx Pt advised to send all vaccination records or screening   Return in about 2 weeks (around 07/11/2023) for chronic disease f/u.    The patient was advised to call back or seek an in-person evaluation if the symptoms worsen or if the condition fails to improve as anticipated.  I discussed the assessment and treatment plan with the patient. The patient was provided an opportunity to ask questions and all were answered. The patient agreed with the plan and demonstrated an understanding of the instructions.  I, Debera Lat, PA-C have reviewed all documentation for this visit. The documentation on  06/27/2023   for the exam, diagnosis, procedures, and orders are all accurate and complete.  Debera Lat, [facility], MMS [facility] [phone] (phone) [phone] (fax)  [facility]

## 2023-06-28 DIAGNOSIS — R5383 Other fatigue: Secondary | ICD-10-CM | POA: Diagnosis not present

## 2023-06-29 ENCOUNTER — Encounter: Payer: Self-pay | Admitting: Physician Assistant

## 2023-06-29 LAB — COMPREHENSIVE METABOLIC PANEL
ALT: 8 [IU]/L (ref 0–32)
AST: 13 [IU]/L (ref 0–40)
Albumin: 4 g/dL (ref 3.9–4.9)
Alkaline Phosphatase: 51 [IU]/L (ref 44–121)
BUN/Creatinine Ratio: 23 (ref 9–23)
BUN: 15 mg/dL (ref 6–20)
Bilirubin Total: 0.7 mg/dL (ref 0.0–1.2)
CO2: 24 mmol/L (ref 20–29)
Calcium: 9.3 mg/dL (ref 8.7–10.2)
Chloride: 104 mmol/L (ref 96–106)
Creatinine, Ser: 0.64 mg/dL (ref 0.57–1.00)
Globulin, Total: 2.7 g/dL (ref 1.5–4.5)
Glucose: 85 mg/dL (ref 70–99)
Potassium: 4.2 mmol/L (ref 3.5–5.2)
Sodium: 139 mmol/L (ref 134–144)
Total Protein: 6.7 g/dL (ref 6.0–8.5)
eGFR: 120 mL/min/{1.73_m2} (ref >59)

## 2023-06-29 LAB — CBC WITH DIFFERENTIAL/PLATELET
Basophils Absolute: 0.1 10*3/uL (ref 0.0–0.2)
Basos: 1 %
EOS (ABSOLUTE): 0.1 10*3/uL (ref 0.0–0.4)
Eos: 2 %
Hematocrit: 37.6 % (ref 34.0–46.6)
Hemoglobin: 12.1 g/dL (ref 11.1–15.9)
Immature Grans (Abs): 0 10*3/uL (ref 0.0–0.1)
Immature Granulocytes: 0 %
Lymphocytes Absolute: 1.9 10*3/uL (ref 0.7–3.1)
Lymphs: 37 %
MCH: 28.9 pg (ref 26.6–33.0)
MCHC: 32.2 g/dL (ref 31.5–35.7)
MCV: 90 fL (ref 79–97)
Monocytes Absolute: 0.3 10*3/uL (ref 0.1–0.9)
Monocytes: 6 %
Neutrophils Absolute: 2.8 10*3/uL (ref 1.4–7.0)
Neutrophils: 54 %
Platelets: 252 10*3/uL (ref 150–450)
RBC: 4.19 x10E6/uL (ref 3.77–5.28)
RDW: 12.1 % (ref 11.7–15.4)
WBC: 5.2 10*3/uL (ref 3.4–10.8)

## 2023-06-29 LAB — LIPID PANEL
Chol/HDL Ratio: 2.3 {ratio} (ref 0.0–4.4)
Cholesterol, Total: 142 mg/dL (ref 100–199)
HDL: 61 mg/dL (ref >39)
LDL Chol Calc (NIH): 71 mg/dL (ref 0–99)
Triglycerides: 46 mg/dL (ref 0–149)
VLDL Cholesterol Cal: 10 mg/dL (ref 5–40)

## 2023-06-29 LAB — HEMOGLOBIN A1C
Est. average glucose Bld gHb Est-mCnc: 97 mg/dL
Hgb A1c MFr Bld: 5 % (ref 4.8–5.6)

## 2023-06-29 LAB — TSH: TSH: 1.16 u[IU]/mL (ref 0.450–4.500)

## 2023-07-13 ENCOUNTER — Ambulatory Visit: Payer: Self-pay | Admitting: Physician Assistant

## 2023-07-17 ENCOUNTER — Encounter: Payer: Self-pay | Admitting: Physician Assistant

## 2023-07-17 ENCOUNTER — Ambulatory Visit (INDEPENDENT_AMBULATORY_CARE_PROVIDER_SITE_OTHER): Payer: Medicaid Other | Admitting: Physician Assistant

## 2023-07-17 VITALS — BP 102/53 | HR 74 | Ht 61.0 in | Wt 145.1 lb

## 2023-07-17 DIAGNOSIS — K5909 Other constipation: Secondary | ICD-10-CM | POA: Diagnosis not present

## 2023-07-17 DIAGNOSIS — R61 Generalized hyperhidrosis: Secondary | ICD-10-CM | POA: Diagnosis not present

## 2023-07-17 DIAGNOSIS — N920 Excessive and frequent menstruation with regular cycle: Secondary | ICD-10-CM

## 2023-07-17 DIAGNOSIS — R5383 Other fatigue: Secondary | ICD-10-CM

## 2023-07-17 DIAGNOSIS — K30 Functional dyspepsia: Secondary | ICD-10-CM

## 2023-07-17 NOTE — Progress Notes (Signed)
Established patient visit  Patient: Cynthia Lara   DOB: 02/19/1991   32 y.o. Female  MRN: [id number] Visit Date: 07/17/2023  Today's healthcare provider: Debera Lat, PA-C   Chief Complaint  Patient presents with   Follow-up    Follow up , pt stated no changes    Subjective      Discussed the use of AI scribe software for clinical note transcription with the patient, who gave verbal consent to proceed.  History of Present Illness   The patient, with a history of heavy menstrual periods and gastrointestinal distress, presents with worsening constipation and lower abdominal pain. The patient reports bowel movements occurring every four to five days, often requiring the use of a laxative. The patient describes the pain as being concentrated in the lower abdomen, particularly after eating to fullness, and often feels bloated. The patient also reports experiencing night sweats two to three times a week. The patient denies any changes in urination or vaginal discharge. The patient's symptoms have been ongoing for a couple of months, with the onset following a severe cold or flu. The patient's blood work and vitals are reported to be within normal limits.           07/17/2023   10:23 AM 06/27/2023    3:05 PM  Depression screen PHQ 2/9  Decreased Interest 0 1  Down, Depressed, Hopeless 0 1  PHQ - 2 Score 0 2  Altered sleeping 0 2  Tired, decreased energy 0 1  Change in appetite 0 1  Feeling bad or failure about yourself  0 1  Trouble concentrating 0 1  Moving slowly or fidgety/restless 0 1  Suicidal thoughts 0 0  PHQ-9 Score 0 9  Difficult doing work/chores Not difficult at all Somewhat difficult      07/17/2023   10:23 AM 06/27/2023    3:05 PM  GAD 7 : Generalized Anxiety Score  Nervous, Anxious, on Edge 0 1  Control/stop worrying 0 1  Worry too much - different things 0 1  Trouble relaxing 0 1  Restless 0 1  Easily annoyed or irritable 0 1  Afraid - awful might happen  0 0  Total GAD 7 Score 0 6  Anxiety Difficulty Not difficult at all Somewhat difficult    Medications: Outpatient Medications Prior to Visit  Medication Sig   ibuprofen (ADVIL) 800 MG tablet Take 1 tablet (800 mg total) by mouth every 8 (eight) hours as needed for moderate pain.   Melatonin 10 MG CAPS Take by mouth as needed.   No facility-administered medications prior to visit.    Review of Systems All negative Except see HPI       Objective    BP (!) 102/53   Pulse 74   Ht 5\' 1"  (1.549 m)   Wt 145 lb 1.6 oz (65.8 kg)   LMP  (LMP Unknown)   SpO2 100%   BMI 27.42 kg/m     Physical Exam Vitals reviewed.  Constitutional:      General: She is not in acute distress.    Appearance: She is well-developed.  HENT:     Head: Normocephalic and atraumatic.  Eyes:     General: No scleral icterus.    Conjunctiva/sclera: Conjunctivae normal.  Cardiovascular:     Rate and Rhythm: Normal rate and regular rhythm.     Heart sounds: Normal heart sounds. No murmur heard. Pulmonary:     Effort: Pulmonary effort is normal. No respiratory distress.  Breath sounds: Normal breath sounds. No wheezing or rales.  Abdominal:     General: There is no distension.     Palpations: Abdomen is soft.     Tenderness: There is abdominal tenderness (lower abdomen, RLQ) in the suprapubic area. There is no guarding or rebound.  Skin:    General: Skin is warm and dry.     Capillary Refill: Capillary refill takes less than 2 seconds.     Findings: No rash.  Neurological:     Mental Status: She is alert and oriented to person, place, and time.  Psychiatric:        Behavior: Behavior normal.      No results found for any visits on 07/17/23.      Assessment and Plan    Abdominal Pain and Constipation Patient reports abdominal pain and constipation for several months. Pain is localized to the lower abdomen and is not associated with any specific type of food. Patient reports infrequent  bowel movements (every 4-5 days) and uses over-the-counter laxatives. No signs of appendicitis or acid reflux. -Refer to gastroenterology for further evaluation. -Advise high fiber diet, increased fluid intake, and daily yogurt consumption. -Consider imaging (x-ray or CT scan) if symptoms persist.  Menorrhagia Patient reports heavy menstrual periods. Pain is localized to the same area as the abdominal pain. No recent gynecological evaluation. -Refer to gynecology for further evaluation.  Fatigue/Night Sweats Patient reports night sweats 2-3 times per week. Recent blood work was within normal limits. -Monitor symptoms and reassess at next visit.  Follow-up in one month to reassess symptoms and evaluate response to dietary changes and specialist referrals.      Orders Placed This Encounter  Procedures   Ambulatory referral to Gastroenterology    Referral Priority:   Urgent    Referral Type:   Consultation    Referral Reason:   Specialty Services Required    Number of Visits Requested:   1   Ambulatory referral to Obstetrics / Gynecology    Referral Priority:   Urgent    Referral Type:   Consultation    Referral Reason:   Specialty Services Required    Requested Specialty:   Obstetrics and Gynecology    Number of Visits Requested:   1    Return in about 4 weeks (around 08/14/2023).   The patient was advised to call back or seek an in-person evaluation if the symptoms worsen or if the condition fails to improve as anticipated.  I discussed the assessment and treatment plan with the patient. The patient was provided an opportunity to ask questions and all were answered. The patient agreed with the plan and demonstrated an understanding of the instructions.  I, Debera Lat, PA-C have reviewed all documentation for this visit. The documentation on 07/17/2023  for the exam, diagnosis, procedures, and orders are all accurate and complete.  Debera Lat, [facility], MMS [facility] [phone] (phone) [phone] (fax)  [facility]

## 2023-07-27 ENCOUNTER — Encounter: Payer: Self-pay | Admitting: Obstetrics and Gynecology

## 2023-08-12 NOTE — Progress Notes (Deleted)
 Established patient visit  Patient: Cynthia Lara   DOB: 1990-06-02   33 y.o. Female  MRN: [id number] Visit Date: 08/14/2023  Today's healthcare provider: Debera Lat, PA-C   No chief complaint on file.  Subjective       Discussed the use of AI scribe software for clinical note transcription with the patient, who gave verbal consent to proceed.  History of Present Illness               07/17/2023   10:23 AM 06/27/2023    3:05 PM  Depression screen PHQ 2/9  Decreased Interest 0 1  Down, Depressed, Hopeless 0 1  PHQ - 2 Score 0 2  Altered sleeping 0 2  Tired, decreased energy 0 1  Change in appetite 0 1  Feeling bad or failure about yourself  0 1  Trouble concentrating 0 1  Moving slowly or fidgety/restless 0 1  Suicidal thoughts 0 0  PHQ-9 Score 0 9  Difficult doing work/chores Not difficult at all Somewhat difficult      07/17/2023   10:23 AM 06/27/2023    3:05 PM  GAD 7 : Generalized Anxiety Score  Nervous, Anxious, on Edge 0 1  Control/stop worrying 0 1  Worry too much - different things 0 1  Trouble relaxing 0 1  Restless 0 1  Easily annoyed or irritable 0 1  Afraid - awful might happen 0 0  Total GAD 7 Score 0 6  Anxiety Difficulty Not difficult at all Somewhat difficult    Medications: Outpatient Medications Prior to Visit  Medication Sig   ibuprofen (ADVIL) 800 MG tablet Take 1 tablet (800 mg total) by mouth every 8 (eight) hours as needed for moderate pain.   Melatonin 10 MG CAPS Take by mouth as needed.   No facility-administered medications prior to visit.    Review of Systems  All other systems reviewed and are negative.  All negative Except see HPI   {Insert previous labs (optional):23779} {See past labs  Heme  Chem  Endocrine  Serology  Results Review (optional):1}   Objective    LMP  (LMP Unknown)  {Insert last BP/Wt (optional):23777}{See vitals history (optional):1}   Physical Exam Vitals reviewed.  Constitutional:       General: She is not in acute distress.    Appearance: Normal appearance. She is well-developed. She is not diaphoretic.  HENT:     Head: Normocephalic and atraumatic.  Eyes:     General: No scleral icterus.    Conjunctiva/sclera: Conjunctivae normal.  Neck:     Thyroid: No thyromegaly.  Cardiovascular:     Rate and Rhythm: Normal rate and regular rhythm.     Pulses: Normal pulses.     Heart sounds: Normal heart sounds. No murmur heard. Pulmonary:     Effort: Pulmonary effort is normal. No respiratory distress.     Breath sounds: Normal breath sounds. No wheezing, rhonchi or rales.  Musculoskeletal:     Cervical back: Neck supple.     Right lower leg: No edema.     Left lower leg: No edema.  Lymphadenopathy:     Cervical: No cervical adenopathy.  Skin:    General: Skin is warm and dry.     Findings: No rash.  Neurological:     Mental Status: She is alert and oriented to person, place, and time. Mental status is at baseline.  Psychiatric:        Mood and Affect: Mood normal.  Behavior: Behavior normal.      No results found for any visits on 08/14/23.      Assessment and Plan             No orders of the defined types were placed in this encounter.   No follow-ups on file.   The patient was advised to call back or seek an in-person evaluation if the symptoms worsen or if the condition fails to improve as anticipated.  I discussed the assessment and treatment plan with the patient. The patient was provided an opportunity to ask questions and all were answered. The patient agreed with the plan and demonstrated an understanding of the instructions.  I, Debera Lat, PA-C have reviewed all documentation for this visit. The documentation on 08/14/2023  for the exam, diagnosis, procedures, and orders are all accurate and complete.  Debera Lat, [facility], MMS [facility] [phone] (phone) [phone] (fax)  [facility]

## 2023-08-14 ENCOUNTER — Ambulatory Visit: Payer: Medicaid Other | Admitting: Physician Assistant

## 2023-08-14 DIAGNOSIS — K5909 Other constipation: Secondary | ICD-10-CM

## 2023-08-14 DIAGNOSIS — R61 Generalized hyperhidrosis: Secondary | ICD-10-CM

## 2023-08-14 DIAGNOSIS — N920 Excessive and frequent menstruation with regular cycle: Secondary | ICD-10-CM

## 2023-08-14 DIAGNOSIS — R5383 Other fatigue: Secondary | ICD-10-CM

## 2023-08-14 DIAGNOSIS — K30 Functional dyspepsia: Secondary | ICD-10-CM

## 2023-08-14 DIAGNOSIS — R55 Syncope and collapse: Secondary | ICD-10-CM

## 2023-11-08 DIAGNOSIS — R112 Nausea with vomiting, unspecified: Secondary | ICD-10-CM | POA: Diagnosis not present

## 2023-11-08 DIAGNOSIS — K5900 Constipation, unspecified: Secondary | ICD-10-CM | POA: Diagnosis not present

## 2023-11-08 DIAGNOSIS — R197 Diarrhea, unspecified: Secondary | ICD-10-CM | POA: Diagnosis not present

## 2023-11-26 DIAGNOSIS — Z3202 Encounter for pregnancy test, result negative: Secondary | ICD-10-CM | POA: Diagnosis not present

## 2023-11-26 DIAGNOSIS — R109 Unspecified abdominal pain: Secondary | ICD-10-CM | POA: Diagnosis not present

## 2023-11-26 DIAGNOSIS — M546 Pain in thoracic spine: Secondary | ICD-10-CM | POA: Diagnosis not present

## 2024-01-18 DIAGNOSIS — M7711 Lateral epicondylitis, right elbow: Secondary | ICD-10-CM | POA: Diagnosis not present

## 2024-01-18 NOTE — Progress Notes (Signed)
 Pt presents with rt  arm pain x 2-3 weeks .

## 2024-02-25 NOTE — Progress Notes (Unsigned)
 Established patient visit  Patient: Cynthia Lara   DOB: Dec 06, 1990   33 y.o. Female  MRN: [id number] Visit Date: 02/26/2024  Today's healthcare provider: Jolynn Spencer, PA-C   No chief complaint on file.  Subjective       Discussed the use of AI scribe software for clinical note transcription with the patient, who gave verbal consent to proceed.  History of Present Illness        07/17/2023   10:23 AM 06/27/2023    3:05 PM  Depression screen PHQ 2/9  Decreased Interest 0 1  Down, Depressed, Hopeless 0 1  PHQ - 2 Score 0 2  Altered sleeping 0 2  Tired, decreased energy 0 1  Change in appetite 0 1  Feeling bad or failure about yourself  0 1  Trouble concentrating 0 1  Moving slowly or fidgety/restless 0 1  Suicidal thoughts 0 0  PHQ-9 Score 0 9  Difficult doing work/chores Not difficult at all Somewhat difficult      07/17/2023   10:23 AM 06/27/2023    3:05 PM  GAD 7 : Generalized Anxiety Score  Nervous, Anxious, on Edge 0 1  Control/stop worrying 0 1  Worry too much - different things 0 1  Trouble relaxing 0 1  Restless 0 1  Easily annoyed or irritable 0 1  Afraid - awful might happen 0 0  Total GAD 7 Score 0 6  Anxiety Difficulty Not difficult at all Somewhat difficult    Medications: Outpatient Medications Prior to Visit  Medication Sig  . ibuprofen  (ADVIL ) 800 MG tablet Take 1 tablet (800 mg total) by mouth every 8 (eight) hours as needed for moderate pain.  . Melatonin 10 MG CAPS Take by mouth as needed.   No facility-administered medications prior to visit.    Review of Systems  All other systems reviewed and are negative.  All negative Except see HPI   {Insert previous labs (optional):23779} {See past labs  Heme  Chem  Endocrine  Serology  Results Review (optional):1}   Objective    There were no vitals taken for this visit. {Insert last BP/Wt (optional):23777}{See vitals history (optional):1}   Physical Exam Vitals reviewed.   Constitutional:      General: She is not in acute distress.    Appearance: Normal appearance. She is well-developed. She is not diaphoretic.  HENT:     Head: Normocephalic and atraumatic.  Eyes:     General: No scleral icterus.    Conjunctiva/sclera: Conjunctivae normal.  Neck:     Thyroid : No thyromegaly.  Cardiovascular:     Rate and Rhythm: Normal rate and regular rhythm.     Pulses: Normal pulses.     Heart sounds: Normal heart sounds. No murmur heard. Pulmonary:     Effort: Pulmonary effort is normal. No respiratory distress.     Breath sounds: Normal breath sounds. No wheezing, rhonchi or rales.  Musculoskeletal:     Cervical back: Neck supple.     Right lower leg: No edema.     Left lower leg: No edema.  Lymphadenopathy:     Cervical: No cervical adenopathy.  Skin:    General: Skin is warm and dry.     Findings: No rash.  Neurological:     Mental Status: She is alert and oriented to person, place, and time. Mental status is at baseline.  Psychiatric:        Mood and Affect: Mood normal.        Behavior:  Behavior normal.     No results found for any visits on 02/26/24.      Assessment and Plan Assessment & Plan     No orders of the defined types were placed in this encounter.   No follow-ups on file.   The patient was advised to call back or seek an in-person evaluation if the symptoms worsen or if the condition fails to improve as anticipated.  I discussed the assessment and treatment plan with the patient. The patient was provided an opportunity to ask questions and all were answered. The patient agreed with the plan and demonstrated an understanding of the instructions.  I, Ilee Randleman, PA-C have reviewed all documentation for this visit. The documentation on 02/26/2024  for the exam, diagnosis, procedures, and orders are all accurate and complete.  Jolynn Spencer, [facility], MMS [facility] [phone] (phone) [phone] (fax)  [facility]

## 2024-02-26 ENCOUNTER — Ambulatory Visit: Admitting: Physician Assistant

## 2024-02-26 ENCOUNTER — Encounter: Payer: Self-pay | Admitting: Physician Assistant

## 2024-02-26 VITALS — BP 98/67 | HR 53 | Resp 14 | Ht 61.0 in | Wt 155.0 lb

## 2024-02-26 DIAGNOSIS — R5383 Other fatigue: Secondary | ICD-10-CM

## 2024-02-26 DIAGNOSIS — L819 Disorder of pigmentation, unspecified: Secondary | ICD-10-CM

## 2024-02-26 DIAGNOSIS — E049 Nontoxic goiter, unspecified: Secondary | ICD-10-CM

## 2024-02-26 DIAGNOSIS — R6889 Other general symptoms and signs: Secondary | ICD-10-CM

## 2024-02-26 DIAGNOSIS — F419 Anxiety disorder, unspecified: Secondary | ICD-10-CM | POA: Diagnosis not present

## 2024-02-26 DIAGNOSIS — F32A Depression, unspecified: Secondary | ICD-10-CM | POA: Diagnosis not present

## 2024-02-27 ENCOUNTER — Ambulatory Visit
Admission: RE | Admit: 2024-02-27 | Discharge: 2024-02-27 | Disposition: A | Discharge status: Home / Self Care | Source: Ambulatory Visit | Attending: Physician Assistant | Admitting: Physician Assistant

## 2024-02-27 DIAGNOSIS — R5383 Other fatigue: Secondary | ICD-10-CM | POA: Insufficient documentation

## 2024-02-27 DIAGNOSIS — E049 Nontoxic goiter, unspecified: Secondary | ICD-10-CM | POA: Diagnosis not present

## 2024-02-27 DIAGNOSIS — R0989 Other specified symptoms and signs involving the circulatory and respiratory systems: Secondary | ICD-10-CM | POA: Diagnosis not present

## 2024-02-28 LAB — T4, FREE: Free T4: 1.06 ng/dL (ref 0.82–1.77)

## 2024-02-28 LAB — BASIC METABOLIC PANEL WITH GFR
BUN/Creatinine Ratio: 22 (ref 9–23)
BUN: 17 mg/dL (ref 6–20)
CO2: 21 mmol/L (ref 20–29)
Calcium: 9.1 mg/dL (ref 8.7–10.2)
Chloride: 104 mmol/L (ref 96–106)
Creatinine, Ser: 0.77 mg/dL (ref 0.57–1.00)
Glucose: 89 mg/dL (ref 70–99)
Potassium: 4.1 mmol/L (ref 3.5–5.2)
Sodium: 138 mmol/L (ref 134–144)
eGFR: 104 mL/min/1.73 (ref >59)

## 2024-02-28 LAB — CORTISOL: Cortisol: 14.5 ug/dL (ref 6.2–19.4)

## 2024-02-28 LAB — ACTH: ACTH: 9.9 pg/mL (ref 7.2–63.3)

## 2024-02-28 LAB — VITAMIN D 25 HYDROXY (VIT D DEFICIENCY, FRACTURES): Vit D, 25-Hydroxy: 33.1 ng/mL (ref 30.0–100.0)

## 2024-02-28 LAB — TSH: TSH: 1.17 u[IU]/mL (ref 0.450–4.500)

## 2024-03-04 ENCOUNTER — Ambulatory Visit: Payer: Self-pay | Admitting: Physician Assistant

## 2024-03-27 ENCOUNTER — Ambulatory Visit: Admitting: Physician Assistant

## 2024-03-27 VITALS — BP 97/71 | HR 60 | Temp 98.0°F | Ht 61.0 in | Wt 154.6 lb

## 2024-03-27 DIAGNOSIS — Z8639 Personal history of other endocrine, nutritional and metabolic disease: Secondary | ICD-10-CM | POA: Insufficient documentation

## 2024-03-27 DIAGNOSIS — E663 Overweight: Secondary | ICD-10-CM | POA: Insufficient documentation

## 2024-03-27 DIAGNOSIS — F32A Depression, unspecified: Secondary | ICD-10-CM | POA: Insufficient documentation

## 2024-03-27 DIAGNOSIS — F419 Anxiety disorder, unspecified: Secondary | ICD-10-CM | POA: Diagnosis not present

## 2024-03-27 DIAGNOSIS — E049 Nontoxic goiter, unspecified: Secondary | ICD-10-CM | POA: Insufficient documentation

## 2024-03-27 DIAGNOSIS — R5383 Other fatigue: Secondary | ICD-10-CM | POA: Diagnosis not present

## 2024-03-27 NOTE — Progress Notes (Signed)
 Established patient visit  Patient: Cynthia Lara   DOB: December 06, 1990   33 y.o. Female  MRN: [id number] Visit Date: 03/27/2024  Today's healthcare provider: Jolynn Spencer, PA-C   Chief Complaint  Patient presents with   Medical Management of Chronic Issues    Reports that she is always feeling nauseous and never goes away.  Vaccines declined.   Subjective     HPI     Medical Management of Chronic Issues    Additional comments: Reports that she is always feeling nauseous and never goes away.  Vaccines declined.      Last edited by Terrel Powell CROME, CMA on 03/27/2024  8:25 AM.       Discussed the use of AI scribe software for clinical note transcription with the patient, who gave verbal consent to proceed.  History of Present Illness Eliane Domenico is a 32 year old female who presents with fatigue and dizziness.  She has experienced persistent fatigue and dizziness for four to five months, worsening over time.  She has tendinitis-related pain. No vaginal discharge or urinary issues.  Her medical history includes B12 deficiency, with the last injection in 2025. She is not taking B12 supplements. Previous tests for thyroid  function, cortisol levels, blood sugar, kidney function, and electrolytes were normal.  She reports poor sleep quality and some anxiety, without formal diagnosis or treatment. 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 She manages responsibilities for two children without nearby family support. No use of recreational drugs, alcohol, or smoking.       02/26/2024    8:13 AM 07/17/2023   10:23 AM 06/27/2023    3:05 PM  Depression screen PHQ 2/9  Decreased Interest 2 0 1  Down, Depressed, Hopeless 2 0 1  PHQ - 2 Score 4 0 2  Altered sleeping 3 0 2  Tired, decreased energy 2 0 1  Change in appetite 2 0 1  Feeling bad or failure about yourself  1 0 1  Trouble concentrating 1 0 1  Moving slowly or  fidgety/restless 1 0 1  Suicidal thoughts 0 0 0  PHQ-9 Score 14 0 9  Difficult doing work/chores Somewhat difficult Not difficult at all Somewhat difficult      02/26/2024    8:13 AM 07/17/2023   10:23 AM 06/27/2023    3:05 PM  GAD 7 : Generalized Anxiety Score  Nervous, Anxious, on Edge 1 0 1  Control/stop worrying 1 0 1  Worry too much - different things 1 0 1  Trouble relaxing 1 0 1  Restless 1 0 1  Easily annoyed or irritable 0 0 1  Afraid - awful might happen 0 0 0  Total GAD 7 Score 5 0 6  Anxiety Difficulty Somewhat difficult Not difficult at all Somewhat difficult    Medications: No outpatient medications prior to visit.   No facility-administered medications prior to visit.    Review of Systems All negative Except see HPI       Objective    BP 97/71 (BP Location: Right Arm, Patient Position: Sitting, Cuff Size: Normal)   Pulse 60   Temp 98 F (36.7 C) (Oral)   Ht 5' 1 (1.549 m)   Wt 154 lb 9.6 oz (70.1 kg)   LMP 02/04/2024   SpO2 100%   BMI 29.21 kg/m     Physical Exam Vitals reviewed.  Constitutional:      General: She is not in acute distress.  Appearance: Normal appearance. She is well-developed. She is not diaphoretic.  HENT:     Head: Normocephalic and atraumatic.  Eyes:     General: No scleral icterus.    Conjunctiva/sclera: Conjunctivae normal.  Neck:     Thyroid : No thyromegaly.  Cardiovascular:     Rate and Rhythm: Normal rate and regular rhythm.     Pulses: Normal pulses.     Heart sounds: Normal heart sounds. No murmur heard. Pulmonary:     Effort: Pulmonary effort is normal. No respiratory distress.     Breath sounds: Normal breath sounds. No wheezing, rhonchi or rales.  Musculoskeletal:     Cervical back: Neck supple.     Right lower leg: No edema.     Left lower leg: No edema.  Lymphadenopathy:     Cervical: No cervical adenopathy.  Skin:    General: Skin is warm and dry.     Findings: No rash.  Neurological:      Mental Status: She is alert and oriented to person, place, and time. Mental status is at baseline.  Psychiatric:        Mood and Affect: Mood normal.        Behavior: Behavior normal.      No results found for any visits on 03/27/24.      Assessment & Plan Fatigue  Chronic fatigue with sleep disturbances, dizziness, and borderline low blood pressure. Differential includes anemia, vitamin B12 deficiency, and lifestyle factors. Previous labs and imaging normal. Work schedule and diet may contribute. - Order CBC and vitamin B12 level. - Advise dietary modifications for adequate nutrition and energy. - Encourage regular meals and healthy snacks. - Monitor symptoms and adjust lifestyle as needed.  Vitamin B12 deficiency Previous vitamin B12 deficiency without recent supplementation, possibly contributing to fatigue and dizziness. - Order vitamin B12 level. - Consider vitamin B12 supplementation based on lab results.  Depression and anxiety symptoms Symptoms of anxiety and moderate depression score. No current treatment. Symptoms may contribute to fatigue and sleep disturbances. Work schedule may exacerbate. - Refer to counseling services in the clinic. - Consider collaborative care with psychiatry if counseling indicates need for medication. - Encourage participation in counseling to address symptoms.  Other fatigue (Primary) Chronic and stable Workup ordered - CBC with Differential/Platelet - Vitamin B12 Will reassess after  receiving lab results Will FU  Anxiety and depression - Amb ref to Integrated Behavioral Health Collaboration of Care: Medication Management AEB  , Primary Care Provider AEB  , Psychiatrist AEB  , and Referral or follow-up with counselor/therapist AEB    Patient/Guardian was advised Release of Information must be obtained prior to any record release in order to collaborate their care with an outside provider. Patient/Guardian was advised if they have not  already done so to contact the registration department to sign all necessary forms in order for us  to release information regarding their care.   Consent: Patient/Guardian gives verbal consent for treatment and assignment of benefits for services provided during this visit. Patient/Guardian expressed understanding and agreed to proceed.   Goiter US  thyroid  - normal  Overweight (BMI 25.0-29.9) Chronic and stable Body mass index is 29.21 kg/m. Labwork from 02/27/24 was wnl \\Repeat  Vit B12 and CBC Weight loss of 5% of pt's current weight via healthy diet and daily exercise encouraged. Will follow-up   No orders of the defined types were placed in this encounter.   No follow-ups on file.   The patient was advised to call back  or seek an in-person evaluation if the symptoms worsen or if the condition fails to improve as anticipated.  I discussed the assessment and treatment plan with the patient. The patient was provided an opportunity to ask questions and all were answered. The patient agreed with the plan and demonstrated an understanding of the instructions.  I, Jeffie Spivack, PA-C have reviewed all documentation for this visit. The documentation on 03/27/2024  for the exam, diagnosis, procedures, and orders are all accurate and complete.  Jolynn Spencer, [facility], MMS [facility] [phone] (phone) [phone] (fax)  [facility]

## 2024-03-28 LAB — CBC WITH DIFFERENTIAL/PLATELET
Basophils Absolute: 0.1 x10E3/uL (ref 0.0–0.2)
Basos: 1 %
EOS (ABSOLUTE): 0.2 x10E3/uL (ref 0.0–0.4)
Eos: 3 %
Hematocrit: 37.6 % (ref 34.0–46.6)
Hemoglobin: 12.2 g/dL (ref 11.1–15.9)
Immature Grans (Abs): 0 x10E3/uL (ref 0.0–0.1)
Immature Granulocytes: 0 %
Lymphocytes Absolute: 1.9 x10E3/uL (ref 0.7–3.1)
Lymphs: 32 %
MCH: 29.8 pg (ref 26.6–33.0)
MCHC: 32.4 g/dL (ref 31.5–35.7)
MCV: 92 fL (ref 79–97)
Monocytes Absolute: 0.4 x10E3/uL (ref 0.1–0.9)
Monocytes: 6 %
Neutrophils Absolute: 3.4 x10E3/uL (ref 1.4–7.0)
Neutrophils: 58 %
Platelets: 242 x10E3/uL (ref 150–450)
RBC: 4.1 x10E6/uL (ref 3.77–5.28)
RDW: 11.9 % (ref 11.7–15.4)
WBC: 5.9 x10E3/uL (ref 3.4–10.8)

## 2024-03-28 LAB — VITAMIN B12: Vitamin B-12: 300 pg/mL (ref 232–1245)

## 2024-03-31 ENCOUNTER — Ambulatory Visit: Payer: Self-pay | Admitting: Physician Assistant

## 2024-04-14 ENCOUNTER — Emergency Department
Admission: EM | Admit: 2024-04-14 | Discharge: 2024-04-14 | Disposition: A | Discharge status: Home / Self Care | Attending: Emergency Medicine | Admitting: Emergency Medicine

## 2024-04-14 ENCOUNTER — Other Ambulatory Visit: Payer: Self-pay

## 2024-04-14 ENCOUNTER — Telehealth: Payer: Self-pay

## 2024-04-14 ENCOUNTER — Ambulatory Visit: Payer: Self-pay

## 2024-04-14 DIAGNOSIS — H5712 Ocular pain, left eye: Secondary | ICD-10-CM | POA: Diagnosis not present

## 2024-04-14 DIAGNOSIS — H16142 Punctate keratitis, left eye: Secondary | ICD-10-CM | POA: Diagnosis not present

## 2024-04-14 MED ORDER — TETRACAINE HCL 0.5 % OP SOLN
2.0000 [drp] | Freq: Once | OPHTHALMIC | Status: DC
  Filled 2024-04-14: qty 4

## 2024-04-14 MED ORDER — FLUORESCEIN SODIUM 1 MG OP STRP
1.0000 | ORAL_STRIP | Freq: Once | OPHTHALMIC | Status: DC
  Filled 2024-04-14: qty 1

## 2024-04-14 NOTE — Telephone Encounter (Signed)
 Pt is at ED now.   Copied from CRM [id number]. Topic: Clinical - Red Word Triage >> Apr 14, 2024 11:03 AM Olam RAMAN wrote: Red Word that prompted transfer to Nurse Triage: : Sudden light sensitivity with pain behind left eye getting worse over the course of a week

## 2024-04-14 NOTE — Telephone Encounter (Signed)
 Patient disconnected prior to transfer to NT. I attempted to call the patient back without success. Voicemail message left for the patient to call back. Routing for additional attempts.      Copied from CRM #[id number]. Topic: Clinical - Red Word Triage >> Apr 14, 2024 10:45 AM Tinnie BROCKS wrote: Red Word that prompted transfer to Nurse Triage: Sudden light sensitivity with pain behind left eye getting worse over the course of a week

## 2024-04-14 NOTE — Discharge Instructions (Signed)
 You are being transferred via personal vehicle to [facility] to be evaluated by Dr. Feliciano Ober.  Report today [place] location directly from the ER.

## 2024-04-14 NOTE — Telephone Encounter (Signed)
 Pt currently in the ED- Called and Left VM to call back for HFU appt.

## 2024-04-14 NOTE — ED Provider Notes (Signed)
 [facility] Emergency Department Provider Note     Event Date/Time   First MD Initiated Contact with Patient 04/14/24 1214     (approximate)   History   Eye Pain   HPI  Cynthia Lara is a 33 y.o. female with a history, presents to the ED endorsing left eye pain and pressure.  Patient reports onset of symptoms last night.  Denies any recent injury, trauma, falls.  Just 1 week earlier, she would endorse bilateral eye pain and pressure.  Patient changed contacts which are daily disposable contacts about 3 weeks ago.  She denies prolonged use of her disposable contacts.  She now presents with pain rated at a 7 out of 10.  She is also endorsing some light sensitivity.  Patient gives a history of headaches, but has never experienced headache pain primarily in her eye.  She is endorsing some pain behind the left eye at this time.  No nausea, vomiting, tinnitus, vertigo, vision loss, or hearing loss reported.     Physical Exam   Triage Vital Signs: ED Triage Vitals  Encounter Vitals Group     BP 04/14/24 1134 (!) 149/82     Girls Systolic BP Percentile --      Girls Diastolic BP Percentile --      Boys Systolic BP Percentile --      Boys Diastolic BP Percentile --      Pulse Rate 04/14/24 1134 (!) 53     Resp 04/14/24 1134 17     Temp 04/14/24 1134 98 F (36.7 C)     Temp src --      SpO2 04/14/24 1134 100 %     Weight --      Height --      Head Circumference --      Peak Flow --      Pain Score 04/14/24 1132 7     Pain Loc --      Pain Education --      Exclude from Growth Chart --     Most recent vital signs: Vitals:   04/14/24 1134  BP: (!) 149/82  Pulse: (!) 53  Resp: 17  Temp: 98 F (36.7 C)  SpO2: 100%    General Awake, no distress. NAD. sitting in the darkened room HEENT NCAT. PERRL. EOMI. No rhinorrhea. Mucous membranes are moist. OD with some mild conjunctival injection.  No blepharitis is noted.  No gross foreign bodies  appreciated.  Patient with a stable-appearing iris nevus noted at the 5 o'clock position of the central left iris.  No fluorescein dye uptake noted.  Normal fundi bilaterally. CV:  Good peripheral perfusion.  RESP:  Normal effort.    ED Results / Procedures / Treatments   Labs (all labs ordered are listed, but only abnormal results are displayed) Labs Reviewed - No data to display   EKG   RADIOLOGY   No results found.   PROCEDURES:  Critical Care performed: No  Procedures   Visual Acuity  Right Eye Distance: 20/25 Left Eye Distance: 20/70 Bilateral Distance:     MEDICATIONS ORDERED IN ED: Medications  tetracaine (PONTOCAINE) 0.5 % ophthalmic solution 2 drop (has no administration in time range)  fluorescein ophthalmic strip 1 strip (has no administration in time range)     IMPRESSION / MDM / ASSESSMENT AND PLAN / ED COURSE  I reviewed the triage vital signs and the nursing notes.  Differential diagnosis includes, but is not limited to, uveitis, iritis, conjunctivitis, eye trauma, migraine presentation   Patient's presentation is most consistent with acute, uncomplicated illness.  Clinical Course as of 04/14/24 1734  Mon Apr 14, 2024  1341 Spoke with Dr. Feliciano Ober (ophthalmology), regarding the case.  He suggest he can evaluate the patient in the office for possible uveitis/iritis.  He will work the patient into the clinic schedule at the [facility] location. [JM]    Clinical Course User Index [JM] Denea Cheaney, Candida LULLA Kings, PA-C    Patient's diagnosis is consistent with acute left eye pain and photosensitivity.  Patient presents with clinical symptoms concerning for uveitis/iritis versus an ocular migraine.  ED workup is overall reassuring.  No gross foreign body, globe trauma, or large corneal abrasion/ulceration noted.  Patient is to follow up with [facility] as needed or otherwise directed. Patient is  given ED precautions to return to the ED for any worsening or new symptoms.    FINAL CLINICAL IMPRESSION(S) / ED DIAGNOSES   Final diagnoses:  Left eye pain     Rx / DC Orders   ED Discharge Orders     None        Note:  This document was prepared using Dragon voice recognition software and may include unintentional dictation errors.    Loyd Candida LULLA Kings, PA-C 04/14/24 1735    Arlander Charleston, MD 04/17/24 [phone]

## 2024-04-14 NOTE — ED Triage Notes (Signed)
 Pt comes in via pov with complaints of left eye pain and pressure that started last night. Pt states that she felt the pain in both eyes about a week ago, but now only feels it in her left. Pt switched out her contacts about 3 weeks ago. Pt complains of pain 7/10. Pt endorses sensitivity to light in the left eye. Pt has removed her left contact yesterday with no relief. Pt denies trauma to eyes.

## 2024-04-26 NOTE — Progress Notes (Signed)
 Established patient visit  Patient: Cynthia Lara   DOB: 1991/04/25   33 y.o. Female  MRN: [id number] Visit Date: 04/28/2024  Today's healthcare provider: Jolynn Spencer, PA-C   Chief Complaint  Patient presents with   Follow-up    1 month   Subjective      Discussed the use of AI scribe software for clinical note transcription with the patient, who gave verbal consent to proceed.  History of Present Illness Cynthia Lara is a 33 year old female who presents with eye pain and fatigue.  On November 7 she developed severe eye pain with extreme photophobia and could not open the affected eye, which led to an ER visit. An ophthalmologist diagnosed uveitis or iritis. She was not given prescription drops. Over-the-counter eye drops have improved symptoms.  She has fatigue and feels very tired while working 13 to 16-hour days. She has vitamin B deficiency in the past, but recent labs show normal B12 and vitamin D  levels. She feels stable regarding depression and anxiety.  She has menorrhagia, is not on birth control, and is not taking any medication for bleeding.  She has abdominal pain and bloating after eating sweets, which she describes as severe. She avoids sweets to prevent this. She notes a dull, achy abdominal pain a couple of hours after eating, without burning typical of reflux. She is not taking medication for this.  She has chronic constipation, which she attributes to her diet, and drinks a lot of water. She gets full quickly and has not seen gastroenterology.  Normal labs from 03/27/24     02/26/2024    8:13 AM 07/17/2023   10:23 AM 06/27/2023    3:05 PM  Depression screen PHQ 2/9  Decreased Interest 2 0 1  Down, Depressed, Hopeless 2 0 1  PHQ - 2 Score 4 0 2  Altered sleeping 3 0 2  Tired, decreased energy 2 0 1  Change in appetite 2 0 1  Feeling bad or failure about yourself  1 0 1  Trouble concentrating 1 0 1  Moving slowly or fidgety/restless 1 0 1  Suicidal  thoughts 0 0 0  PHQ-9 Score 14  0  9   Difficult doing work/chores Somewhat difficult Not difficult at all Somewhat difficult     Data saved with a previous flowsheet row definition      02/26/2024    8:13 AM 07/17/2023   10:23 AM 06/27/2023    3:05 PM  GAD 7 : Generalized Anxiety Score  Nervous, Anxious, on Edge 1 0 1  Control/stop worrying 1 0 1  Worry too much - different things 1 0 1  Trouble relaxing 1 0 1  Restless 1 0 1  Easily annoyed or irritable 0 0 1  Afraid - awful might happen 0 0 0  Total GAD 7 Score 5 0 6  Anxiety Difficulty Somewhat difficult Not difficult at all Somewhat difficult    Medications: No outpatient medications prior to visit.   No facility-administered medications prior to visit.    Review of Systems  All other systems reviewed and are negative.  All negative Except see HPI       Objective    BP 103/71   Pulse (!) 55   Resp 14   Ht 5' 1 (1.549 m)   Wt 145 lb (65.8 kg)   LMP 03/24/2024 (Exact Date)   SpO2 100%   BMI 27.40 kg/m     Physical Exam Vitals reviewed.  Constitutional:  General: She is not in acute distress.    Appearance: Normal appearance. She is well-developed. She is not diaphoretic.  HENT:     Head: Normocephalic and atraumatic.  Eyes:     General: No scleral icterus.    Conjunctiva/sclera: Conjunctivae normal.  Neck:     Thyroid : No thyromegaly.  Cardiovascular:     Rate and Rhythm: Normal rate and regular rhythm.     Pulses: Normal pulses.     Heart sounds: Normal heart sounds. No murmur heard. Pulmonary:     Effort: Pulmonary effort is normal. No respiratory distress.     Breath sounds: Normal breath sounds. No wheezing, rhonchi or rales.  Musculoskeletal:     Cervical back: Neck supple.     Right lower leg: No edema.     Left lower leg: No edema.  Lymphadenopathy:     Cervical: No cervical adenopathy.  Skin:    General: Skin is warm and dry.     Findings: No rash.  Neurological:     Mental  Status: She is alert and oriented to person, place, and time. Mental status is at baseline.  Psychiatric:        Mood and Affect: Mood normal.        Behavior: Behavior normal.      No results found for any visits on 04/28/24.      Assessment & Plan Fatigue Chronic fatigue post-cholecystectomy and post-COVID. Normal labs. Possible depression and anxiety contribution. - Encouraged counseling for depression and anxiety. - Advised taking daily multivitamins with vitamin D . Will follow-up  Depression and anxiety Chronic    02/26/2024    8:13 AM 07/17/2023   10:23 AM 06/27/2023    3:05 PM  PHQ9 SCORE ONLY  PHQ-9 Total Score 14 0  9      Data saved with a previous flowsheet row definition      02/26/2024    8:13 AM 07/17/2023   10:23 AM 06/27/2023    3:05 PM  GAD 7 : Generalized Anxiety Score  Nervous, Anxious, on Edge 1 0 1  Control/stop worrying 1 0 1  Worry too much - different things 1 0 1  Trouble relaxing 1 0 1  Restless 1 0 1  Easily annoyed or irritable 0 0 1  Afraid - awful might happen 0 0 0  Total GAD 7 Score 5 0 6  Anxiety Difficulty Somewhat difficult Not difficult at all Somewhat difficult   May contribute to fatigue. No current counseling. - Encouraged scheduling an appointment with counseling services. Continue with Hussami Christina. Will follow-up  Constipation Chronic constipation possibly diet-related. Reports feeling full quickly. - Advised dietary modifications including increased intake of yogurt and prunes.  Pt was advised: -Eliminate medications that cause constipation. -Increase fluid intake. -Increase soluble fiber (25-30g/day) in diet. -Encourage regular defecation attempts after eating. -Regular exercise -Enemas if other methods fail- Bulking agents (accompanied by adequate fluids)  Psyllium  (Konsyl, Metamucil, Perdiem Fiber): 1 tbsp (approx 3.5 grams fiber) in 8-oz liquid PO daily up to TID Polyethylene glycol (PEG) (MiraLAX) 17 g/day  PO dissolved in 4 to 8 oz of beverage (current evidence shows PEG to be superior to lactulose advised to add prunes, yogurt to her daily diet. 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 No current medication use. - Advised trial of Pepcid  for symptom relief. Consider ultrasound of liver to check for retained stones, primary bile  duct stones, biliary stenosis , and sphincter of Oddi dysfunction. Ddx gerd, pancreatitis, and intestinal disorders - Instructed to monitor symptoms and return for reassessment if pain persists.   Vitamin D  insufficiency Vitamin D  level low normal. - Advised taking daily multivitamins with vitamin D .   Overweight (BMI 25.0-29.9) Chronic and stable Body mass index is 27.4 kg/m. Weight loss of 5% of pt's current weight via healthy diet and daily exercise encouraged. Lab work reviewed. Will follow-up    No orders of the defined types were placed in this encounter.   Return in about 3 months (around 07/27/2024) for CPE on 07/25/24.   The patient was advised to call back or seek an in-person evaluation if the symptoms worsen or if the condition fails to improve as anticipated.  I discussed the assessment and treatment plan with the patient. The patient was provided an opportunity to ask questions and all were answered. The patient agreed with the plan and demonstrated an understanding of the instructions.  I, Khaden Gater, PA-C have reviewed all documentation for this visit. The documentation on 04/28/2024  for the exam, diagnosis, procedures, and orders are all accurate and complete.  Jolynn Spencer, [facility], MMS [facility] [phone] (phone) [phone] (fax)  [facility]

## 2024-04-28 ENCOUNTER — Ambulatory Visit: Admitting: Physician Assistant

## 2024-04-28 ENCOUNTER — Encounter: Payer: Self-pay | Admitting: Physician Assistant

## 2024-04-28 VITALS — BP 103/71 | HR 55 | Resp 14 | Ht 61.0 in | Wt 145.0 lb

## 2024-04-28 DIAGNOSIS — R5383 Other fatigue: Secondary | ICD-10-CM

## 2024-04-28 DIAGNOSIS — K5909 Other constipation: Secondary | ICD-10-CM

## 2024-04-28 DIAGNOSIS — R1010 Upper abdominal pain, unspecified: Secondary | ICD-10-CM | POA: Insufficient documentation

## 2024-04-28 DIAGNOSIS — E663 Overweight: Secondary | ICD-10-CM | POA: Diagnosis not present

## 2024-04-28 DIAGNOSIS — F32A Depression, unspecified: Secondary | ICD-10-CM

## 2024-04-28 DIAGNOSIS — E559 Vitamin D deficiency, unspecified: Secondary | ICD-10-CM | POA: Insufficient documentation

## 2024-04-28 DIAGNOSIS — E049 Nontoxic goiter, unspecified: Secondary | ICD-10-CM

## 2024-04-28 DIAGNOSIS — E538 Deficiency of other specified B group vitamins: Secondary | ICD-10-CM

## 2024-04-28 DIAGNOSIS — F419 Anxiety disorder, unspecified: Secondary | ICD-10-CM

## 2024-04-28 DIAGNOSIS — R6889 Other general symptoms and signs: Secondary | ICD-10-CM

## 2024-06-17 ENCOUNTER — Encounter: Payer: Self-pay | Admitting: Physician Assistant

## 2024-06-19 ENCOUNTER — Ambulatory Visit

## 2024-06-19 ENCOUNTER — Telehealth: Payer: Self-pay

## 2024-06-19 VITALS — BP 100/68 | HR 65 | Temp 97.9°F

## 2024-06-19 DIAGNOSIS — Z23 Encounter for immunization: Secondary | ICD-10-CM

## 2024-06-19 DIAGNOSIS — Z1159 Encounter for screening for other viral diseases: Secondary | ICD-10-CM

## 2024-06-19 DIAGNOSIS — R42 Dizziness and giddiness: Secondary | ICD-10-CM | POA: Diagnosis not present

## 2024-06-19 NOTE — Telephone Encounter (Signed)
 Called pt to inform she had blood work to get done today. If she could stop by to do blood work with lab hours, left detailed message in mailbox.

## 2024-06-19 NOTE — Telephone Encounter (Signed)
 exercise induced asthmaEKG was done as per order by provider. The results were given to the provider. The patient tolerated it well.

## 2024-06-19 NOTE — Progress Notes (Signed)
" °  ° ° °  Acute visit   Patient: Cynthia Lara   DOB: Nov 18, 1990   33 y.o. Female  MRN: [id number] PCP: Ostwalt, Janna, PA-C   Chief Complaint  Patient presents with   Dizziness    Vomitting    Subjective    Discussed the use of AI scribe software for clinical note transcription with the patient, who gave verbal consent to proceed.  History of Present Illness Cynthia Lara is a 34 year old female who presents with episodes of dizziness and chest tightness after consuming sugary foods.  She experiences dizziness, chest tightness, and profuse sweating after consuming sugary foods. The most recent episode occurred after eating cake and drinking strawberry lemonade, leading to a sensation of heat, chest and stomach tightness, nausea, and eventual vomiting, which alleviated her symptoms. She felt as though she might pass out but did not.  A similar episode occurred nearly a year ago after consuming funnel cake fries, during which she did pass out. She did not seek medical attention at that time.  She often feels lightheaded or dizzy when transitioning from sitting to standing, which occurs frequently. She is not on any regular medications, only occasionally taking vitamins.   Vitals:   06/19/24 0812 06/19/24 0847  BP: (!) 94/54 100/68  Pulse: 62 65  Temp: 97.9 F (36.6 C)   TempSrc: Oral   SpO2: 99%     Review of systems as noted in HPI.   Objective    BP 100/68 (BP Location: Left Arm, Patient Position: Standing)   Pulse 65   Temp 97.9 F (36.6 C) (Oral)   LMP 06/05/2024   SpO2 99%   Physical Exam Constitutional:      Appearance: Normal appearance.  HENT:     Head: Normocephalic and atraumatic.     Mouth/Throat:     Mouth: Mucous membranes are moist.  Eyes:     Pupils: Pupils are equal, round, and reactive to light.  Cardiovascular:     Rate and Rhythm: Normal rate and regular rhythm.  Pulmonary:     Effort: Pulmonary effort is normal.  Skin:    General: Skin  is warm.  Neurological:     General: No focal deficit present.     Mental Status: She is alert.     EKG: sinus bradycardia  No results found for any visits on 06/19/24.  Assessment & Plan     Problem List Items Addressed This Visit   None Visit Diagnoses       Dizziness    -  Primary   Relevant Orders   CBC   Basic metabolic panel with GFR   Iron, TIBC and Ferritin Panel   EKG 12-Lead      Assessment & Plan Dizziness Patient with 2 episodes of post-prandial dizziness, chest tightness, and diaphoresis relieved by vomiting. Both episodes occurred after eating sugary, rich meals.  EKG today showed sinus bradycardia. Orthostats negative, although blood pressure borderline low. Differential includes vasovagal episodes, postprandial hypotension.  - Ordered blood counts, electrolytes, iron levels. - Advised increased fluid and electrolyte intake. - Recommend smaller, more frequent meals - Return precautions given   No orders of the defined types were placed in this encounter.    No follow-ups on file.      Isaiah DELENA Pepper, MD  [facility] [phone] (phone) [phone] (fax) "

## 2024-06-20 ENCOUNTER — Telehealth: Payer: Self-pay | Admitting: Licensed Clinical Social Worker

## 2024-06-20 NOTE — Telephone Encounter (Signed)
 Changing [facility] appt from in person to virtual due to inclement weather

## 2024-06-23 ENCOUNTER — Institutional Professional Consult (permissible substitution): Payer: Self-pay | Admitting: Licensed Clinical Social Worker

## 2024-07-28 ENCOUNTER — Ambulatory Visit: Admitting: Physician Assistant

## 2024-07-28 ENCOUNTER — Encounter: Admitting: Physician Assistant
